# Patient Record
Sex: Male | Born: 1940 | Race: White | Hispanic: No | Marital: Married | Smoking: Never smoker
Health system: Southern US, Community
[De-identification: ages and names within clinical notes are randomized; demographics above are authoritative.]

## PROBLEM LIST (undated history)

## (undated) DIAGNOSIS — K219 Gastro-esophageal reflux disease without esophagitis: Secondary | ICD-10-CM

## (undated) DIAGNOSIS — E119 Type 2 diabetes mellitus without complications: Secondary | ICD-10-CM

## (undated) DIAGNOSIS — E785 Hyperlipidemia, unspecified: Secondary | ICD-10-CM

## (undated) HISTORY — DX: Gastro-esophageal reflux disease without esophagitis: K21.9

## (undated) HISTORY — DX: Hyperlipidemia, unspecified: E78.5

## (undated) HISTORY — DX: Type 2 diabetes mellitus without complications: E11.9

---

## 2005-11-15 ENCOUNTER — Ambulatory Visit: Payer: Self-pay | Admitting: Gastroenterology

## 2005-11-27 ENCOUNTER — Ambulatory Visit: Payer: Self-pay | Admitting: Otolaryngology

## 2005-11-27 ENCOUNTER — Other Ambulatory Visit: Payer: Self-pay

## 2005-12-06 ENCOUNTER — Ambulatory Visit: Payer: Self-pay | Admitting: Otolaryngology

## 2006-06-05 ENCOUNTER — Ambulatory Visit: Payer: Self-pay | Admitting: Otolaryngology

## 2007-02-23 ENCOUNTER — Ambulatory Visit: Payer: Self-pay | Admitting: Ophthalmology

## 2007-03-30 ENCOUNTER — Ambulatory Visit: Payer: Self-pay | Admitting: Ophthalmology

## 2007-11-11 ENCOUNTER — Ambulatory Visit: Payer: Self-pay | Admitting: Otolaryngology

## 2007-11-18 ENCOUNTER — Ambulatory Visit: Payer: Self-pay | Admitting: Otolaryngology

## 2009-11-20 ENCOUNTER — Ambulatory Visit: Payer: Self-pay | Admitting: Radiation Oncology

## 2009-12-11 ENCOUNTER — Ambulatory Visit: Payer: Self-pay | Admitting: Radiation Oncology

## 2009-12-21 ENCOUNTER — Ambulatory Visit: Payer: Self-pay | Admitting: Radiation Oncology

## 2011-04-23 HISTORY — PX: VOCAL CORD INJECTION: SHX2663

## 2011-08-23 ENCOUNTER — Emergency Department: Payer: Self-pay | Admitting: *Deleted

## 2011-12-24 ENCOUNTER — Ambulatory Visit: Payer: Self-pay | Admitting: Otolaryngology

## 2011-12-25 ENCOUNTER — Ambulatory Visit: Payer: Self-pay | Admitting: Otolaryngology

## 2011-12-26 HISTORY — PX: HERNIA REPAIR: SHX51

## 2011-12-27 LAB — PATHOLOGY REPORT

## 2013-09-27 ENCOUNTER — Ambulatory Visit (INDEPENDENT_AMBULATORY_CARE_PROVIDER_SITE_OTHER): Payer: Medicare Other | Admitting: General Surgery

## 2013-09-27 ENCOUNTER — Encounter: Payer: Self-pay | Admitting: General Surgery

## 2013-09-27 VITALS — BP 128/72 | HR 74 | Resp 14 | Ht 67.0 in | Wt 175.0 lb

## 2013-09-27 DIAGNOSIS — R109 Unspecified abdominal pain: Secondary | ICD-10-CM

## 2013-09-27 DIAGNOSIS — R1032 Left lower quadrant pain: Secondary | ICD-10-CM

## 2013-09-27 NOTE — Progress Notes (Signed)
Patient ID: OLSON LUCARELLI, male   DOB: [birth year], 73 y.o.   MRN: [id number]  Chief Complaint  Patient presents with  . Other    inguinal hernia    HPI LUIGI STUCKEY is a 73 y.o. male here today for an evaluation discomfort at the siteof his left inguinal hernia repair completed in 2013.  He states the left inguinal area their is an burning pain been going on since his surgery. This has not interfered with his activity. He was hoping that his symptoms would resolve.  The patient reports when he is been placed on anti-inflammatories in the past for his left hip bursitis he is appreciated an improvement in the burning sensation in the left groin. HPI    Past Medical History  Diagnosis Date  . Diabetes mellitus without complication   . GERD (gastroesophageal reflux disease)   . Hyperlipidemia     Past Surgical History  Procedure Laterality Date  . Hernia repair Left 12/26/2011  . Vocal cord injection  2013    biopsy    No family history on file.  Social History History  Substance Use Topics  . Smoking status: Never Smoker   . Smokeless tobacco: Never Used  . Alcohol Use: Yes    No Known Allergies  Current Outpatient Prescriptions  Medication Sig Dispense Refill  . finasteride (PROSCAR) 5 MG tablet Take 1 tablet by mouth daily.      Marland Kitchen glipiZIDE (GLUCOTROL XL) 2.5 MG 24 hr tablet Take 1 tablet by mouth daily.      . meloxicam (MOBIC) 15 MG tablet Take 1 tablet by mouth daily.      Marland Kitchen omeprazole (PRILOSEC) 40 MG capsule Take 1 capsule by mouth daily.      . pravastatin (PRAVACHOL) 20 MG tablet Take 1 tablet by mouth daily.       No current facility-administered medications for this visit.    Review of Systems Review of Systems  Blood pressure 128/72, pulse 74, resp. rate 14, height 5\' 7"  (1.702 m), weight 175 lb (79.379 kg).  Physical Exam Physical Exam  Constitutional: He is oriented to person, place, and time. He appears well-developed and well-nourished.   Eyes: Conjunctivae are normal. No scleral icterus.  Neck: Neck supple.  Cardiovascular: Normal rate, regular rhythm and normal heart sounds.   Pulmonary/Chest: Effort normal and breath sounds normal.  Abdominal: Soft. Normal appearance and bowel sounds are normal. There is no hepatomegaly. There is no tenderness. A hernia is present. Hernia confirmed positive in the right inguinal area (small). Hernia confirmed negative in the left inguinal area.  Neurological: He is alert and oriented to person, place, and time.  Skin: Skin is warm and dry.    Data Reviewed December 26, 2011 operative note. Ultrapro mesh placed for repair of a indirect hernia.  Assessment    Possible residual nerve irritation status post hernia repair.     Plan    The patient declined steroid injection at this time, anticipating a new prescription for prednisone with the orthopedic service. He was encouraged to call should he desire reevaluation.     PCP: Dorna Mai Kawena Lyday 09/28/2013, 6:58 PM

## 2013-09-27 NOTE — Patient Instructions (Signed)
Patient to return as needed. 

## 2013-09-28 ENCOUNTER — Ambulatory Visit: Payer: Self-pay | Admitting: Anesthesiology

## 2013-09-28 DIAGNOSIS — R1032 Left lower quadrant pain: Secondary | ICD-10-CM | POA: Insufficient documentation

## 2013-09-28 LAB — BASIC METABOLIC PANEL
Anion Gap: 2 — ABNORMAL LOW (ref 7–16)
BUN: 20 mg/dL — AB (ref 7–18)
CALCIUM: 9 mg/dL (ref 8.5–10.1)
Chloride: 107 mmol/L (ref 98–107)
Co2: 29 mmol/L (ref 21–32)
Creatinine: 0.98 mg/dL (ref 0.60–1.30)
Glucose: 43 mg/dL — ABNORMAL LOW (ref 65–99)
Osmolality: 275 (ref 275–301)
Potassium: 4.1 mmol/L (ref 3.5–5.1)
Sodium: 138 mmol/L (ref 136–145)

## 2013-09-29 ENCOUNTER — Ambulatory Visit: Payer: Self-pay | Admitting: Otolaryngology

## 2013-09-30 LAB — PATHOLOGY REPORT

## 2013-10-04 ENCOUNTER — Ambulatory Visit: Payer: Self-pay | Admitting: Radiation Oncology

## 2013-10-06 ENCOUNTER — Ambulatory Visit: Payer: Self-pay | Admitting: Radiation Oncology

## 2013-10-14 ENCOUNTER — Encounter: Payer: Self-pay | Admitting: General Surgery

## 2013-10-20 ENCOUNTER — Ambulatory Visit: Payer: Self-pay | Admitting: Radiation Oncology

## 2013-11-01 LAB — CBC CANCER CENTER
Basophil #: 0 x10 3/mm (ref 0.0–0.1)
Basophil %: 0.4 %
EOS ABS: 0.3 x10 3/mm (ref 0.0–0.7)
EOS PCT: 4.4 %
HCT: 38.8 % — ABNORMAL LOW (ref 40.0–52.0)
HGB: 12.8 g/dL — ABNORMAL LOW (ref 13.0–18.0)
LYMPHS ABS: 2.3 x10 3/mm (ref 1.0–3.6)
Lymphocyte %: 32.7 %
MCH: 30.6 pg (ref 26.0–34.0)
MCHC: 33 g/dL (ref 32.0–36.0)
MCV: 93 fL (ref 80–100)
Monocyte #: 0.9 x10 3/mm (ref 0.2–1.0)
Monocyte %: 13.5 %
Neutrophil #: 3.4 x10 3/mm (ref 1.4–6.5)
Neutrophil %: 49 %
PLATELETS: 275 x10 3/mm (ref 150–440)
RBC: 4.19 10*6/uL — ABNORMAL LOW (ref 4.40–5.90)
RDW: 13.3 % (ref 11.5–14.5)
WBC: 7 x10 3/mm (ref 3.8–10.6)

## 2013-11-08 LAB — CBC CANCER CENTER
Basophil #: 0 x10 3/mm (ref 0.0–0.1)
Basophil %: 0.6 %
Eosinophil #: 0.2 x10 3/mm (ref 0.0–0.7)
Eosinophil %: 3.6 %
HCT: 40.4 % (ref 40.0–52.0)
HGB: 13.3 g/dL (ref 13.0–18.0)
Lymphocyte #: 1.7 x10 3/mm (ref 1.0–3.6)
Lymphocyte %: 26.1 %
MCH: 30.2 pg (ref 26.0–34.0)
MCHC: 32.9 g/dL (ref 32.0–36.0)
MCV: 92 fL (ref 80–100)
Monocyte #: 0.8 x10 3/mm (ref 0.2–1.0)
Monocyte %: 11.9 %
Neutrophil #: 3.8 x10 3/mm (ref 1.4–6.5)
Neutrophil %: 57.8 %
Platelet: 281 x10 3/mm (ref 150–440)
RBC: 4.4 10*6/uL (ref 4.40–5.90)
RDW: 13.7 % (ref 11.5–14.5)
WBC: 6.5 x10 3/mm (ref 3.8–10.6)

## 2013-11-15 LAB — CBC CANCER CENTER
BASOS PCT: 0.5 %
Basophil #: 0 x10 3/mm (ref 0.0–0.1)
EOS PCT: 4.9 %
Eosinophil #: 0.4 x10 3/mm (ref 0.0–0.7)
HCT: 39.1 % — AB (ref 40.0–52.0)
HGB: 12.9 g/dL — AB (ref 13.0–18.0)
LYMPHS ABS: 1.9 x10 3/mm (ref 1.0–3.6)
LYMPHS PCT: 23.6 %
MCH: 30.8 pg (ref 26.0–34.0)
MCHC: 33.1 g/dL (ref 32.0–36.0)
MCV: 93 fL (ref 80–100)
MONOS PCT: 13.1 %
Monocyte #: 1 x10 3/mm (ref 0.2–1.0)
Neutrophil #: 4.6 x10 3/mm (ref 1.4–6.5)
Neutrophil %: 57.9 %
PLATELETS: 274 x10 3/mm (ref 150–440)
RBC: 4.2 10*6/uL — ABNORMAL LOW (ref 4.40–5.90)
RDW: 13.6 % (ref 11.5–14.5)
WBC: 7.9 x10 3/mm (ref 3.8–10.6)

## 2013-11-20 ENCOUNTER — Ambulatory Visit: Payer: Self-pay | Admitting: Radiation Oncology

## 2013-11-22 LAB — CBC CANCER CENTER
Basophil #: 0 x10 3/mm (ref 0.0–0.1)
Basophil %: 0.5 %
Eosinophil #: 0.3 x10 3/mm (ref 0.0–0.7)
Eosinophil %: 5.2 %
HCT: 38.7 % — ABNORMAL LOW (ref 40.0–52.0)
HGB: 12.8 g/dL — ABNORMAL LOW (ref 13.0–18.0)
LYMPHS PCT: 28.1 %
Lymphocyte #: 1.8 x10 3/mm (ref 1.0–3.6)
MCH: 30.9 pg (ref 26.0–34.0)
MCHC: 33.1 g/dL (ref 32.0–36.0)
MCV: 93 fL (ref 80–100)
Monocyte #: 0.9 x10 3/mm (ref 0.2–1.0)
Monocyte %: 13 %
Neutrophil #: 3.5 x10 3/mm (ref 1.4–6.5)
Neutrophil %: 53.2 %
Platelet: 270 x10 3/mm (ref 150–440)
RBC: 4.15 10*6/uL — ABNORMAL LOW (ref 4.40–5.90)
RDW: 13.5 % (ref 11.5–14.5)
WBC: 6.6 x10 3/mm (ref 3.8–10.6)

## 2013-11-29 LAB — CBC CANCER CENTER
Basophil #: 0 x10 3/mm (ref 0.0–0.1)
Basophil %: 0.4 %
EOS PCT: 4.1 %
Eosinophil #: 0.3 x10 3/mm (ref 0.0–0.7)
HCT: 38.6 % — ABNORMAL LOW (ref 40.0–52.0)
HGB: 12.8 g/dL — ABNORMAL LOW (ref 13.0–18.0)
LYMPHS ABS: 1.9 x10 3/mm (ref 1.0–3.6)
Lymphocyte %: 27 %
MCH: 30.7 pg (ref 26.0–34.0)
MCHC: 33.2 g/dL (ref 32.0–36.0)
MCV: 93 fL (ref 80–100)
MONO ABS: 0.9 x10 3/mm (ref 0.2–1.0)
MONOS PCT: 12.9 %
NEUTROS ABS: 4 x10 3/mm (ref 1.4–6.5)
NEUTROS PCT: 55.6 %
PLATELETS: 283 x10 3/mm (ref 150–440)
RBC: 4.17 10*6/uL — AB (ref 4.40–5.90)
RDW: 13.2 % (ref 11.5–14.5)
WBC: 7.2 x10 3/mm (ref 3.8–10.6)

## 2013-12-06 LAB — CBC CANCER CENTER
BASOS ABS: 0 x10 3/mm (ref 0.0–0.1)
Basophil %: 0.4 %
Eosinophil #: 0.3 x10 3/mm (ref 0.0–0.7)
Eosinophil %: 5.3 %
HCT: 38.4 % — AB (ref 40.0–52.0)
HGB: 12.4 g/dL — ABNORMAL LOW (ref 13.0–18.0)
LYMPHS PCT: 23.6 %
Lymphocyte #: 1.4 x10 3/mm (ref 1.0–3.6)
MCH: 30.1 pg (ref 26.0–34.0)
MCHC: 32.3 g/dL (ref 32.0–36.0)
MCV: 93 fL (ref 80–100)
Monocyte #: 0.8 x10 3/mm (ref 0.2–1.0)
Monocyte %: 13.2 %
NEUTROS ABS: 3.3 x10 3/mm (ref 1.4–6.5)
NEUTROS PCT: 57.5 %
Platelet: 272 x10 3/mm (ref 150–440)
RBC: 4.12 10*6/uL — ABNORMAL LOW (ref 4.40–5.90)
RDW: 13.2 % (ref 11.5–14.5)
WBC: 5.8 x10 3/mm (ref 3.8–10.6)

## 2013-12-21 ENCOUNTER — Ambulatory Visit: Payer: Self-pay | Admitting: Radiation Oncology

## 2014-01-20 ENCOUNTER — Ambulatory Visit: Payer: Self-pay | Admitting: Radiation Oncology

## 2014-03-07 ENCOUNTER — Ambulatory Visit: Payer: Self-pay | Admitting: Radiation Oncology

## 2014-03-20 ENCOUNTER — Emergency Department: Payer: Self-pay | Admitting: Emergency Medicine

## 2014-03-20 LAB — BASIC METABOLIC PANEL
ANION GAP: 7 (ref 7–16)
BUN: 21 mg/dL — AB (ref 7–18)
CHLORIDE: 100 mmol/L (ref 98–107)
CO2: 31 mmol/L (ref 21–32)
CREATININE: 0.98 mg/dL (ref 0.60–1.30)
Calcium, Total: 9 mg/dL (ref 8.5–10.1)
Glucose: 126 mg/dL — ABNORMAL HIGH (ref 65–99)
Osmolality: 280 (ref 275–301)
Potassium: 4.1 mmol/L (ref 3.5–5.1)
Sodium: 138 mmol/L (ref 136–145)

## 2014-03-20 LAB — CBC
HCT: 41.4 % (ref 40.0–52.0)
HGB: 13.2 g/dL (ref 13.0–18.0)
MCH: 30 pg (ref 26.0–34.0)
MCHC: 31.9 g/dL — ABNORMAL LOW (ref 32.0–36.0)
MCV: 94 fL (ref 80–100)
Platelet: 385 10*3/uL (ref 150–440)
RBC: 4.41 10*6/uL (ref 4.40–5.90)
RDW: 13.2 % (ref 11.5–14.5)
WBC: 10.5 10*3/uL (ref 3.8–10.6)

## 2014-03-20 LAB — TROPONIN I: Troponin-I: <0.02 ng/mL

## 2014-03-22 ENCOUNTER — Ambulatory Visit: Payer: Self-pay | Admitting: Radiation Oncology

## 2014-04-02 ENCOUNTER — Inpatient Hospital Stay: Payer: Self-pay | Admitting: Internal Medicine

## 2014-04-02 LAB — CBC
HCT: 42.4 % (ref 40.0–52.0)
HGB: 13.9 g/dL (ref 13.0–18.0)
MCH: 30.4 pg (ref 26.0–34.0)
MCHC: 32.7 g/dL (ref 32.0–36.0)
MCV: 93 fL (ref 80–100)
Platelet: 448 10*3/uL — ABNORMAL HIGH (ref 150–440)
RBC: 4.55 10*6/uL (ref 4.40–5.90)
RDW: 12.7 % (ref 11.5–14.5)
WBC: 8.6 10*3/uL (ref 3.8–10.6)

## 2014-04-02 LAB — BASIC METABOLIC PANEL
Anion Gap: 6 — ABNORMAL LOW (ref 7–16)
BUN: 22 mg/dL — AB (ref 7–18)
CALCIUM: 8.9 mg/dL (ref 8.5–10.1)
CHLORIDE: 102 mmol/L (ref 98–107)
Co2: 31 mmol/L (ref 21–32)
Creatinine: 1.01 mg/dL (ref 0.60–1.30)
EGFR (African American): >60
Glucose: 182 mg/dL — ABNORMAL HIGH (ref 65–99)
Osmolality: 286 (ref 275–301)
Potassium: 3.8 mmol/L (ref 3.5–5.1)
Sodium: 139 mmol/L (ref 136–145)

## 2014-04-02 LAB — TROPONIN I: TROPONIN-I: 0.02 ng/mL

## 2014-04-03 LAB — CBC WITH DIFFERENTIAL/PLATELET
Basophil #: 0 10*3/uL (ref 0.0–0.1)
Basophil %: 0.1 %
EOS PCT: 0 %
Eosinophil #: 0 10*3/uL (ref 0.0–0.7)
HCT: 38.6 % — AB (ref 40.0–52.0)
HGB: 12.8 g/dL — ABNORMAL LOW (ref 13.0–18.0)
LYMPHS PCT: 6.6 %
Lymphocyte #: 0.6 10*3/uL — ABNORMAL LOW (ref 1.0–3.6)
MCH: 30.7 pg (ref 26.0–34.0)
MCHC: 33.2 g/dL (ref 32.0–36.0)
MCV: 92 fL (ref 80–100)
Monocyte #: 0.2 x10 3/mm (ref 0.2–1.0)
Monocyte %: 1.9 %
NEUTROS ABS: 7.9 10*3/uL — AB (ref 1.4–6.5)
Neutrophil %: 91.4 %
Platelet: 389 10*3/uL (ref 150–440)
RBC: 4.18 10*6/uL — AB (ref 4.40–5.90)
RDW: 12.6 % (ref 11.5–14.5)
WBC: 8.7 10*3/uL (ref 3.8–10.6)

## 2014-04-03 LAB — BASIC METABOLIC PANEL
Anion Gap: 6 — ABNORMAL LOW (ref 7–16)
BUN: 15 mg/dL (ref 7–18)
Calcium, Total: 8.6 mg/dL (ref 8.5–10.1)
Chloride: 104 mmol/L (ref 98–107)
Co2: 30 mmol/L (ref 21–32)
Creatinine: 0.84 mg/dL (ref 0.60–1.30)
EGFR (African American): >60
EGFR (Non-African Amer.): >60
Glucose: 170 mg/dL — ABNORMAL HIGH (ref 65–99)
Osmolality: 284 (ref 275–301)
Potassium: 4.3 mmol/L (ref 3.5–5.1)
Sodium: 140 mmol/L (ref 136–145)

## 2014-04-29 DIAGNOSIS — B379 Candidiasis, unspecified: Secondary | ICD-10-CM | POA: Diagnosis not present

## 2014-04-29 DIAGNOSIS — R490 Dysphonia: Secondary | ICD-10-CM | POA: Diagnosis not present

## 2014-04-29 DIAGNOSIS — J370 Chronic laryngitis: Secondary | ICD-10-CM | POA: Diagnosis not present

## 2014-04-29 DIAGNOSIS — F064 Anxiety disorder due to known physiological condition: Secondary | ICD-10-CM | POA: Diagnosis not present

## 2014-04-29 DIAGNOSIS — Z87891 Personal history of nicotine dependence: Secondary | ICD-10-CM | POA: Diagnosis not present

## 2014-05-03 ENCOUNTER — Ambulatory Visit: Payer: Self-pay | Admitting: Radiation Oncology

## 2014-05-04 DIAGNOSIS — C329 Malignant neoplasm of larynx, unspecified: Secondary | ICD-10-CM | POA: Diagnosis not present

## 2014-05-05 DIAGNOSIS — Z08 Encounter for follow-up examination after completed treatment for malignant neoplasm: Secondary | ICD-10-CM | POA: Diagnosis not present

## 2014-05-05 DIAGNOSIS — Z8521 Personal history of malignant neoplasm of larynx: Secondary | ICD-10-CM | POA: Diagnosis not present

## 2014-05-05 DIAGNOSIS — R590 Localized enlarged lymph nodes: Secondary | ICD-10-CM | POA: Diagnosis not present

## 2014-05-05 DIAGNOSIS — Z87891 Personal history of nicotine dependence: Secondary | ICD-10-CM | POA: Diagnosis not present

## 2014-05-08 DIAGNOSIS — Z930 Tracheostomy status: Secondary | ICD-10-CM | POA: Diagnosis not present

## 2014-05-08 DIAGNOSIS — C320 Malignant neoplasm of glottis: Secondary | ICD-10-CM | POA: Diagnosis not present

## 2014-05-08 DIAGNOSIS — C760 Malignant neoplasm of head, face and neck: Secondary | ICD-10-CM | POA: Diagnosis not present

## 2014-05-08 DIAGNOSIS — K449 Diaphragmatic hernia without obstruction or gangrene: Secondary | ICD-10-CM | POA: Diagnosis not present

## 2014-05-08 DIAGNOSIS — R221 Localized swelling, mass and lump, neck: Secondary | ICD-10-CM | POA: Diagnosis not present

## 2014-05-08 DIAGNOSIS — I10 Essential (primary) hypertension: Secondary | ICD-10-CM | POA: Diagnosis not present

## 2014-05-08 DIAGNOSIS — M6250 Muscle wasting and atrophy, not elsewhere classified, unspecified site: Secondary | ICD-10-CM | POA: Diagnosis not present

## 2014-05-08 DIAGNOSIS — R1310 Dysphagia, unspecified: Secondary | ICD-10-CM | POA: Diagnosis not present

## 2014-05-08 DIAGNOSIS — J9600 Acute respiratory failure, unspecified whether with hypoxia or hypercapnia: Secondary | ICD-10-CM | POA: Diagnosis not present

## 2014-05-08 DIAGNOSIS — R05 Cough: Secondary | ICD-10-CM | POA: Diagnosis not present

## 2014-05-08 DIAGNOSIS — C329 Malignant neoplasm of larynx, unspecified: Secondary | ICD-10-CM | POA: Diagnosis not present

## 2014-05-08 DIAGNOSIS — R070 Pain in throat: Secondary | ICD-10-CM | POA: Diagnosis not present

## 2014-05-08 DIAGNOSIS — D170 Benign lipomatous neoplasm of skin and subcutaneous tissue of head, face and neck: Secondary | ICD-10-CM | POA: Diagnosis not present

## 2014-05-08 DIAGNOSIS — I517 Cardiomegaly: Secondary | ICD-10-CM | POA: Diagnosis not present

## 2014-05-08 DIAGNOSIS — C801 Malignant (primary) neoplasm, unspecified: Secondary | ICD-10-CM | POA: Diagnosis not present

## 2014-05-08 DIAGNOSIS — M6281 Muscle weakness (generalized): Secondary | ICD-10-CM | POA: Diagnosis not present

## 2014-05-08 DIAGNOSIS — Z6824 Body mass index (BMI) 24.0-24.9, adult: Secondary | ICD-10-CM | POA: Diagnosis not present

## 2014-05-08 DIAGNOSIS — R279 Unspecified lack of coordination: Secondary | ICD-10-CM | POA: Diagnosis not present

## 2014-05-08 DIAGNOSIS — Z515 Encounter for palliative care: Secondary | ICD-10-CM | POA: Diagnosis not present

## 2014-05-08 DIAGNOSIS — I700 Atherosclerosis of aorta: Secondary | ICD-10-CM | POA: Diagnosis not present

## 2014-05-08 DIAGNOSIS — E43 Unspecified severe protein-calorie malnutrition: Secondary | ICD-10-CM | POA: Diagnosis not present

## 2014-05-08 DIAGNOSIS — I080 Rheumatic disorders of both mitral and aortic valves: Secondary | ICD-10-CM | POA: Diagnosis not present

## 2014-05-08 DIAGNOSIS — Z4682 Encounter for fitting and adjustment of non-vascular catheter: Secondary | ICD-10-CM | POA: Diagnosis not present

## 2014-05-08 DIAGNOSIS — R262 Difficulty in walking, not elsewhere classified: Secondary | ICD-10-CM | POA: Diagnosis not present

## 2014-05-08 DIAGNOSIS — K219 Gastro-esophageal reflux disease without esophagitis: Secondary | ICD-10-CM | POA: Diagnosis not present

## 2014-05-08 DIAGNOSIS — Z8521 Personal history of malignant neoplasm of larynx: Secondary | ICD-10-CM | POA: Diagnosis not present

## 2014-05-08 DIAGNOSIS — J9690 Respiratory failure, unspecified, unspecified whether with hypoxia or hypercapnia: Secondary | ICD-10-CM | POA: Diagnosis not present

## 2014-05-08 DIAGNOSIS — R634 Abnormal weight loss: Secondary | ICD-10-CM | POA: Diagnosis not present

## 2014-05-08 DIAGNOSIS — Z79899 Other long term (current) drug therapy: Secondary | ICD-10-CM | POA: Diagnosis not present

## 2014-05-08 DIAGNOSIS — Z87891 Personal history of nicotine dependence: Secondary | ICD-10-CM | POA: Diagnosis not present

## 2014-05-08 DIAGNOSIS — Z882 Allergy status to sulfonamides status: Secondary | ICD-10-CM | POA: Diagnosis not present

## 2014-05-08 DIAGNOSIS — I480 Paroxysmal atrial fibrillation: Secondary | ICD-10-CM | POA: Diagnosis not present

## 2014-05-08 DIAGNOSIS — R531 Weakness: Secondary | ICD-10-CM | POA: Diagnosis not present

## 2014-05-08 DIAGNOSIS — Z923 Personal history of irradiation: Secondary | ICD-10-CM | POA: Diagnosis not present

## 2014-05-08 DIAGNOSIS — Z792 Long term (current) use of antibiotics: Secondary | ICD-10-CM | POA: Diagnosis not present

## 2014-05-08 DIAGNOSIS — R627 Adult failure to thrive: Secondary | ICD-10-CM | POA: Diagnosis not present

## 2014-05-08 DIAGNOSIS — J387 Other diseases of larynx: Secondary | ICD-10-CM | POA: Diagnosis not present

## 2014-05-08 DIAGNOSIS — J9601 Acute respiratory failure with hypoxia: Secondary | ICD-10-CM | POA: Diagnosis not present

## 2014-05-08 DIAGNOSIS — R102 Pelvic and perineal pain: Secondary | ICD-10-CM | POA: Diagnosis not present

## 2014-05-08 DIAGNOSIS — I4891 Unspecified atrial fibrillation: Secondary | ICD-10-CM | POA: Diagnosis not present

## 2014-05-08 DIAGNOSIS — I4430 Unspecified atrioventricular block: Secondary | ICD-10-CM | POA: Diagnosis not present

## 2014-05-08 DIAGNOSIS — I058 Other rheumatic mitral valve diseases: Secondary | ICD-10-CM | POA: Diagnosis not present

## 2014-05-08 DIAGNOSIS — E118 Type 2 diabetes mellitus with unspecified complications: Secondary | ICD-10-CM | POA: Diagnosis not present

## 2014-05-08 DIAGNOSIS — Z7952 Long term (current) use of systemic steroids: Secondary | ICD-10-CM | POA: Diagnosis not present

## 2014-05-08 DIAGNOSIS — K1321 Leukoplakia of oral mucosa, including tongue: Secondary | ICD-10-CM | POA: Diagnosis not present

## 2014-05-08 DIAGNOSIS — E119 Type 2 diabetes mellitus without complications: Secondary | ICD-10-CM | POA: Diagnosis not present

## 2014-05-08 DIAGNOSIS — Z79891 Long term (current) use of opiate analgesic: Secondary | ICD-10-CM | POA: Diagnosis not present

## 2014-05-08 DIAGNOSIS — K5900 Constipation, unspecified: Secondary | ICD-10-CM | POA: Diagnosis not present

## 2014-05-08 DIAGNOSIS — R633 Feeding difficulties: Secondary | ICD-10-CM | POA: Diagnosis not present

## 2014-05-08 DIAGNOSIS — E860 Dehydration: Secondary | ICD-10-CM | POA: Diagnosis not present

## 2014-05-19 DIAGNOSIS — K5900 Constipation, unspecified: Secondary | ICD-10-CM | POA: Diagnosis not present

## 2014-05-19 DIAGNOSIS — A419 Sepsis, unspecified organism: Secondary | ICD-10-CM | POA: Diagnosis not present

## 2014-05-19 DIAGNOSIS — Z931 Gastrostomy status: Secondary | ICD-10-CM | POA: Diagnosis not present

## 2014-05-19 DIAGNOSIS — F4322 Adjustment disorder with anxiety: Secondary | ICD-10-CM | POA: Diagnosis not present

## 2014-05-19 DIAGNOSIS — K449 Diaphragmatic hernia without obstruction or gangrene: Secondary | ICD-10-CM | POA: Diagnosis not present

## 2014-05-19 DIAGNOSIS — I4891 Unspecified atrial fibrillation: Secondary | ICD-10-CM | POA: Diagnosis not present

## 2014-05-19 DIAGNOSIS — I509 Heart failure, unspecified: Secondary | ICD-10-CM | POA: Diagnosis not present

## 2014-05-19 DIAGNOSIS — J189 Pneumonia, unspecified organism: Secondary | ICD-10-CM | POA: Diagnosis not present

## 2014-05-19 DIAGNOSIS — Z930 Tracheostomy status: Secondary | ICD-10-CM | POA: Diagnosis not present

## 2014-05-19 DIAGNOSIS — D62 Acute posthemorrhagic anemia: Secondary | ICD-10-CM | POA: Diagnosis not present

## 2014-05-19 DIAGNOSIS — E860 Dehydration: Secondary | ICD-10-CM | POA: Diagnosis not present

## 2014-05-19 DIAGNOSIS — I252 Old myocardial infarction: Secondary | ICD-10-CM | POA: Diagnosis not present

## 2014-05-19 DIAGNOSIS — R531 Weakness: Secondary | ICD-10-CM | POA: Diagnosis not present

## 2014-05-19 DIAGNOSIS — R1310 Dysphagia, unspecified: Secondary | ICD-10-CM | POA: Diagnosis not present

## 2014-05-19 DIAGNOSIS — E785 Hyperlipidemia, unspecified: Secondary | ICD-10-CM | POA: Diagnosis not present

## 2014-05-19 DIAGNOSIS — J9601 Acute respiratory failure with hypoxia: Secondary | ICD-10-CM | POA: Diagnosis not present

## 2014-05-19 DIAGNOSIS — C159 Malignant neoplasm of esophagus, unspecified: Secondary | ICD-10-CM | POA: Diagnosis not present

## 2014-05-19 DIAGNOSIS — K219 Gastro-esophageal reflux disease without esophagitis: Secondary | ICD-10-CM | POA: Diagnosis not present

## 2014-05-19 DIAGNOSIS — Z923 Personal history of irradiation: Secondary | ICD-10-CM | POA: Diagnosis not present

## 2014-05-19 DIAGNOSIS — R627 Adult failure to thrive: Secondary | ICD-10-CM | POA: Diagnosis not present

## 2014-05-19 DIAGNOSIS — R070 Pain in throat: Secondary | ICD-10-CM | POA: Diagnosis not present

## 2014-05-19 DIAGNOSIS — I480 Paroxysmal atrial fibrillation: Secondary | ICD-10-CM | POA: Diagnosis not present

## 2014-05-19 DIAGNOSIS — C329 Malignant neoplasm of larynx, unspecified: Secondary | ICD-10-CM | POA: Diagnosis not present

## 2014-05-19 DIAGNOSIS — Z87891 Personal history of nicotine dependence: Secondary | ICD-10-CM | POA: Diagnosis not present

## 2014-05-19 DIAGNOSIS — E871 Hypo-osmolality and hyponatremia: Secondary | ICD-10-CM | POA: Diagnosis not present

## 2014-05-19 DIAGNOSIS — Z8521 Personal history of malignant neoplasm of larynx: Secondary | ICD-10-CM | POA: Diagnosis not present

## 2014-05-19 DIAGNOSIS — J9600 Acute respiratory failure, unspecified whether with hypoxia or hypercapnia: Secondary | ICD-10-CM | POA: Diagnosis not present

## 2014-05-19 DIAGNOSIS — M6281 Muscle weakness (generalized): Secondary | ICD-10-CM | POA: Diagnosis not present

## 2014-05-19 DIAGNOSIS — Z515 Encounter for palliative care: Secondary | ICD-10-CM | POA: Diagnosis not present

## 2014-05-19 DIAGNOSIS — I700 Atherosclerosis of aorta: Secondary | ICD-10-CM | POA: Diagnosis not present

## 2014-05-19 DIAGNOSIS — E873 Alkalosis: Secondary | ICD-10-CM | POA: Diagnosis not present

## 2014-05-19 DIAGNOSIS — J690 Pneumonitis due to inhalation of food and vomit: Secondary | ICD-10-CM | POA: Diagnosis not present

## 2014-05-19 DIAGNOSIS — I10 Essential (primary) hypertension: Secondary | ICD-10-CM | POA: Diagnosis not present

## 2014-05-19 DIAGNOSIS — E43 Unspecified severe protein-calorie malnutrition: Secondary | ICD-10-CM | POA: Diagnosis not present

## 2014-05-19 DIAGNOSIS — Z048 Encounter for examination and observation for other specified reasons: Secondary | ICD-10-CM | POA: Diagnosis not present

## 2014-05-19 DIAGNOSIS — F329 Major depressive disorder, single episode, unspecified: Secondary | ICD-10-CM | POA: Diagnosis not present

## 2014-05-19 DIAGNOSIS — R279 Unspecified lack of coordination: Secondary | ICD-10-CM | POA: Diagnosis not present

## 2014-05-19 DIAGNOSIS — I214 Non-ST elevation (NSTEMI) myocardial infarction: Secondary | ICD-10-CM | POA: Diagnosis not present

## 2014-05-19 DIAGNOSIS — R262 Difficulty in walking, not elsewhere classified: Secondary | ICD-10-CM | POA: Diagnosis not present

## 2014-05-19 DIAGNOSIS — E119 Type 2 diabetes mellitus without complications: Secondary | ICD-10-CM | POA: Diagnosis not present

## 2014-05-19 DIAGNOSIS — M6250 Muscle wasting and atrophy, not elsewhere classified, unspecified site: Secondary | ICD-10-CM | POA: Diagnosis not present

## 2014-05-20 DIAGNOSIS — E119 Type 2 diabetes mellitus without complications: Secondary | ICD-10-CM | POA: Diagnosis not present

## 2014-05-20 DIAGNOSIS — C329 Malignant neoplasm of larynx, unspecified: Secondary | ICD-10-CM | POA: Diagnosis not present

## 2014-05-20 DIAGNOSIS — I4891 Unspecified atrial fibrillation: Secondary | ICD-10-CM | POA: Diagnosis not present

## 2014-05-23 ENCOUNTER — Ambulatory Visit: Payer: Self-pay | Admitting: Radiation Oncology

## 2014-05-27 DIAGNOSIS — Z931 Gastrostomy status: Secondary | ICD-10-CM | POA: Diagnosis not present

## 2014-05-27 DIAGNOSIS — E871 Hypo-osmolality and hyponatremia: Secondary | ICD-10-CM | POA: Diagnosis not present

## 2014-05-27 DIAGNOSIS — F4322 Adjustment disorder with anxiety: Secondary | ICD-10-CM | POA: Diagnosis not present

## 2014-05-27 DIAGNOSIS — C329 Malignant neoplasm of larynx, unspecified: Secondary | ICD-10-CM | POA: Diagnosis not present

## 2014-06-03 DIAGNOSIS — E871 Hypo-osmolality and hyponatremia: Secondary | ICD-10-CM | POA: Diagnosis not present

## 2014-06-03 DIAGNOSIS — Z930 Tracheostomy status: Secondary | ICD-10-CM | POA: Diagnosis not present

## 2014-06-03 DIAGNOSIS — Z931 Gastrostomy status: Secondary | ICD-10-CM | POA: Diagnosis not present

## 2014-06-03 DIAGNOSIS — C329 Malignant neoplasm of larynx, unspecified: Secondary | ICD-10-CM | POA: Diagnosis not present

## 2014-06-04 DIAGNOSIS — I4891 Unspecified atrial fibrillation: Secondary | ICD-10-CM | POA: Diagnosis not present

## 2014-06-07 DIAGNOSIS — Z515 Encounter for palliative care: Secondary | ICD-10-CM | POA: Diagnosis not present

## 2014-06-07 DIAGNOSIS — I4949 Other premature depolarization: Secondary | ICD-10-CM | POA: Diagnosis not present

## 2014-06-07 DIAGNOSIS — C329 Malignant neoplasm of larynx, unspecified: Secondary | ICD-10-CM | POA: Diagnosis not present

## 2014-06-07 DIAGNOSIS — I10 Essential (primary) hypertension: Secondary | ICD-10-CM | POA: Diagnosis not present

## 2014-06-07 DIAGNOSIS — M6281 Muscle weakness (generalized): Secondary | ICD-10-CM | POA: Diagnosis not present

## 2014-06-07 DIAGNOSIS — Z9889 Other specified postprocedural states: Secondary | ICD-10-CM | POA: Diagnosis not present

## 2014-06-07 DIAGNOSIS — A499 Bacterial infection, unspecified: Secondary | ICD-10-CM | POA: Diagnosis not present

## 2014-06-07 DIAGNOSIS — Z048 Encounter for examination and observation for other specified reasons: Secondary | ICD-10-CM | POA: Diagnosis not present

## 2014-06-07 DIAGNOSIS — Z87891 Personal history of nicotine dependence: Secondary | ICD-10-CM | POA: Diagnosis not present

## 2014-06-07 DIAGNOSIS — E873 Alkalosis: Secondary | ICD-10-CM | POA: Diagnosis not present

## 2014-06-07 DIAGNOSIS — R000 Tachycardia, unspecified: Secondary | ICD-10-CM | POA: Diagnosis not present

## 2014-06-07 DIAGNOSIS — I700 Atherosclerosis of aorta: Secondary | ICD-10-CM | POA: Diagnosis not present

## 2014-06-07 DIAGNOSIS — J9691 Respiratory failure, unspecified with hypoxia: Secondary | ICD-10-CM | POA: Diagnosis not present

## 2014-06-07 DIAGNOSIS — E871 Hypo-osmolality and hyponatremia: Secondary | ICD-10-CM | POA: Diagnosis not present

## 2014-06-07 DIAGNOSIS — Z930 Tracheostomy status: Secondary | ICD-10-CM | POA: Diagnosis not present

## 2014-06-07 DIAGNOSIS — A419 Sepsis, unspecified organism: Secondary | ICD-10-CM | POA: Diagnosis not present

## 2014-06-07 DIAGNOSIS — R531 Weakness: Secondary | ICD-10-CM | POA: Diagnosis not present

## 2014-06-07 DIAGNOSIS — J90 Pleural effusion, not elsewhere classified: Secondary | ICD-10-CM | POA: Diagnosis not present

## 2014-06-07 DIAGNOSIS — R0602 Shortness of breath: Secondary | ICD-10-CM | POA: Diagnosis not present

## 2014-06-07 DIAGNOSIS — R0902 Hypoxemia: Secondary | ICD-10-CM | POA: Diagnosis not present

## 2014-06-07 DIAGNOSIS — Z452 Encounter for adjustment and management of vascular access device: Secondary | ICD-10-CM | POA: Diagnosis not present

## 2014-06-07 DIAGNOSIS — F329 Major depressive disorder, single episode, unspecified: Secondary | ICD-10-CM | POA: Diagnosis not present

## 2014-06-07 DIAGNOSIS — I499 Cardiac arrhythmia, unspecified: Secondary | ICD-10-CM | POA: Diagnosis not present

## 2014-06-07 DIAGNOSIS — I4581 Long QT syndrome: Secondary | ICD-10-CM | POA: Diagnosis not present

## 2014-06-07 DIAGNOSIS — D649 Anemia, unspecified: Secondary | ICD-10-CM | POA: Diagnosis not present

## 2014-06-07 DIAGNOSIS — K269 Duodenal ulcer, unspecified as acute or chronic, without hemorrhage or perforation: Secondary | ICD-10-CM | POA: Diagnosis not present

## 2014-06-07 DIAGNOSIS — J690 Pneumonitis due to inhalation of food and vomit: Secondary | ICD-10-CM | POA: Diagnosis not present

## 2014-06-07 DIAGNOSIS — K219 Gastro-esophageal reflux disease without esophagitis: Secondary | ICD-10-CM | POA: Diagnosis not present

## 2014-06-07 DIAGNOSIS — E119 Type 2 diabetes mellitus without complications: Secondary | ICD-10-CM | POA: Diagnosis not present

## 2014-06-07 DIAGNOSIS — I252 Old myocardial infarction: Secondary | ICD-10-CM | POA: Diagnosis not present

## 2014-06-07 DIAGNOSIS — R627 Adult failure to thrive: Secondary | ICD-10-CM | POA: Diagnosis not present

## 2014-06-07 DIAGNOSIS — Z0389 Encounter for observation for other suspected diseases and conditions ruled out: Secondary | ICD-10-CM | POA: Diagnosis not present

## 2014-06-07 DIAGNOSIS — K264 Chronic or unspecified duodenal ulcer with hemorrhage: Secondary | ICD-10-CM | POA: Diagnosis not present

## 2014-06-07 DIAGNOSIS — C760 Malignant neoplasm of head, face and neck: Secondary | ICD-10-CM | POA: Diagnosis not present

## 2014-06-07 DIAGNOSIS — R279 Unspecified lack of coordination: Secondary | ICD-10-CM | POA: Diagnosis not present

## 2014-06-07 DIAGNOSIS — Z8521 Personal history of malignant neoplasm of larynx: Secondary | ICD-10-CM | POA: Diagnosis not present

## 2014-06-07 DIAGNOSIS — J984 Other disorders of lung: Secondary | ICD-10-CM | POA: Diagnosis not present

## 2014-06-07 DIAGNOSIS — E785 Hyperlipidemia, unspecified: Secondary | ICD-10-CM | POA: Diagnosis not present

## 2014-06-07 DIAGNOSIS — C159 Malignant neoplasm of esophagus, unspecified: Secondary | ICD-10-CM | POA: Diagnosis not present

## 2014-06-07 DIAGNOSIS — C029 Malignant neoplasm of tongue, unspecified: Secondary | ICD-10-CM | POA: Diagnosis not present

## 2014-06-07 DIAGNOSIS — I480 Paroxysmal atrial fibrillation: Secondary | ICD-10-CM | POA: Diagnosis not present

## 2014-06-07 DIAGNOSIS — I509 Heart failure, unspecified: Secondary | ICD-10-CM | POA: Diagnosis not present

## 2014-06-07 DIAGNOSIS — R918 Other nonspecific abnormal finding of lung field: Secondary | ICD-10-CM | POA: Diagnosis not present

## 2014-06-07 DIAGNOSIS — R7889 Finding of other specified substances, not normally found in blood: Secondary | ICD-10-CM | POA: Diagnosis not present

## 2014-06-07 DIAGNOSIS — J9601 Acute respiratory failure with hypoxia: Secondary | ICD-10-CM | POA: Diagnosis not present

## 2014-06-07 DIAGNOSIS — C3490 Malignant neoplasm of unspecified part of unspecified bronchus or lung: Secondary | ICD-10-CM | POA: Diagnosis not present

## 2014-06-07 DIAGNOSIS — R7989 Other specified abnormal findings of blood chemistry: Secondary | ICD-10-CM | POA: Diagnosis not present

## 2014-06-07 DIAGNOSIS — J9811 Atelectasis: Secondary | ICD-10-CM | POA: Diagnosis not present

## 2014-06-07 DIAGNOSIS — J168 Pneumonia due to other specified infectious organisms: Secondary | ICD-10-CM | POA: Diagnosis not present

## 2014-06-07 DIAGNOSIS — K449 Diaphragmatic hernia without obstruction or gangrene: Secondary | ICD-10-CM | POA: Diagnosis not present

## 2014-06-07 DIAGNOSIS — J439 Emphysema, unspecified: Secondary | ICD-10-CM | POA: Diagnosis not present

## 2014-06-07 DIAGNOSIS — J939 Pneumothorax, unspecified: Secondary | ICD-10-CM | POA: Diagnosis not present

## 2014-06-07 DIAGNOSIS — Z931 Gastrostomy status: Secondary | ICD-10-CM | POA: Diagnosis not present

## 2014-06-07 DIAGNOSIS — I959 Hypotension, unspecified: Secondary | ICD-10-CM | POA: Diagnosis not present

## 2014-06-07 DIAGNOSIS — I517 Cardiomegaly: Secondary | ICD-10-CM | POA: Diagnosis not present

## 2014-06-07 DIAGNOSIS — Z48813 Encounter for surgical aftercare following surgery on the respiratory system: Secondary | ICD-10-CM | POA: Diagnosis not present

## 2014-06-07 DIAGNOSIS — J189 Pneumonia, unspecified organism: Secondary | ICD-10-CM | POA: Diagnosis not present

## 2014-06-07 DIAGNOSIS — K260 Acute duodenal ulcer with hemorrhage: Secondary | ICD-10-CM | POA: Diagnosis not present

## 2014-06-07 DIAGNOSIS — K922 Gastrointestinal hemorrhage, unspecified: Secondary | ICD-10-CM | POA: Diagnosis not present

## 2014-06-07 DIAGNOSIS — E861 Hypovolemia: Secondary | ICD-10-CM | POA: Diagnosis not present

## 2014-06-07 DIAGNOSIS — D62 Acute posthemorrhagic anemia: Secondary | ICD-10-CM | POA: Diagnosis not present

## 2014-06-07 DIAGNOSIS — E43 Unspecified severe protein-calorie malnutrition: Secondary | ICD-10-CM | POA: Diagnosis not present

## 2014-06-07 DIAGNOSIS — I4891 Unspecified atrial fibrillation: Secondary | ICD-10-CM | POA: Diagnosis not present

## 2014-06-07 DIAGNOSIS — I498 Other specified cardiac arrhythmias: Secondary | ICD-10-CM | POA: Diagnosis not present

## 2014-06-07 DIAGNOSIS — I214 Non-ST elevation (NSTEMI) myocardial infarction: Secondary | ICD-10-CM | POA: Diagnosis not present

## 2014-07-22 DEATH — deceased

## 2014-08-09 NOTE — Op Note (Signed)
PATIENT NAME:  William Cuevas, William Cuevas MR#:  [id number] DATE OF BIRTH:  02-Nov-1940  DATE OF PROCEDURE:  12/25/2011  PREOPERATIVE DIAGNOSIS: Left true vocal cord lesion.   POSTOPERATIVE DIAGNOSIS: Left true vocal cord lesion.  PROCEDURE: Microlaryngoscopy with microsurgical excision of left true vocal cord lesion and CO2 laser ablation.   SURGEON: Sammuel Hines. Richardson Landry, MD  ANESTHESIA: General endotracheal.   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   FINDINGS: This was a lesion involving the anterior one-third to one-half of the left true vocal cord. The lesion had a dysplastic appearance with thickening of the upper surface of the vocal cord and some scoring or question of invasion of the underlying vocalis muscle.   DESCRIPTION OF PROCEDURE: After obtaining informed consent, the patient was taken to the Operating Room, placed in supine position. After induction of general endotracheal anesthesia with the laser tube, patient was turned 90 degrees. The head was draped with the eyes protected with moist gauze. A tooth guard was placed to protect the upper teeth. A Dedo laryngoscope was used to then evaluate the airway. The hypopharynx, larynx and tongue base were evaluated. The only lesion seen was the left vocal cord lesion. The scope was placed in the laryngeal inlet and the patient placed into suspension. A scope was used to obtain photo documentation of the left true vocal cord lesion. The microscope was then moved into position for better visualization. The left vocal cord was injected with a small amount of 1% lidocaine with epinephrine 1:200,000. The lesion was then carefully excised utilizing microsurgical excision with the microlaryngeal scissors. Lesion was excised with narrow margins and excised dissecting it away from the underlying vocalis muscle. There was some adhesion to the underlying vocalis muscle either from  scarring from previous procedures or possibly some aspect of invasion of the lesion. This could not be determined grossly. Frozen sections were not sent, however, because if there was invasion found on final pathology, the patient would be referred for radiation therapy at this point without excision of the underlying vocalis muscle due to issues of vocal quality. Two portions of the posterior aspect of the lesion came loose during retraction but the remainder of the lesion was sent as a whole specimen. Lesion did extend to the anterior commissure but did not appear to cross midline. It also extended 3 mm inferior to the free edge of the vocal cord. Minor bleeding was controlled with Afrin moistened pledgets. Some questionable margins deeply and along the periphery were further ablated with the CO2 laser. This was after draping the patient's face with wet towels to prevent skin injury from the laser.  The laser was used on a setting of 8 watts pulsed. The laser beam was slightly defocused to reduce depth of injury. With completion of the laser procedure postprocedure photo documentation was obtained with the telescope and the patient returned to the anesthesiologist to have the endotracheal tube secured further prior to Dr. Dwyane Luo procedure. Dr. Bary Castilla then was left with the patient to perform his surgical procedure.   ____________________________ Sammuel Hines. Richardson Landry, MD psb:cms D: 12/25/2011 09:02:03 ET T: 12/25/2011 11:03:59 ET JOB#: [id number]  cc: Sammuel Hines. Richardson Landry, MD, <Dictator> Riley Nearing MD ELECTRONICALLY SIGNED 12/31/2011 8:35

## 2014-08-09 NOTE — Op Note (Signed)
PATIENT NAME:  William Cuevas, William Cuevas MR#:  [id number] DATE OF BIRTH:  [birth year]  DATE OF PROCEDURE:  12/25/2011  PREOPERATIVE DIAGNOSIS: Symptomatic left inguinal hernia.   POSTOPERATIVE DIAGNOSIS: Symptomatic left inguinal hernia with lipoma of the cord.   OPERATIVE PROCEDURE: Left indirect inguinal hernia repair with Ultrapro mesh.   SURGEON: Robert Bellow, MD  ANESTHESIA: General endotracheal, Marcaine 0.5% plain, 30 mL local infiltration, Toradol 30 mg.   ESTIMATED BLOOD LOSS: Less than 5 mL.   CLINICAL NOTE: This 74 year old male reported a long-standing left inguinal hernia which has recently become symptomatic. He underwent direct laryngoscopy and biopsy under the care Dr. Malon Kindle earlier in the day and is now prepared for a left inguinal hernia repair. His Kefzol ordered prior to his ENT and he was redosed at the two hour mark with an additional 1 gram of Kefzol.   DESCRIPTION OF PROCEDURE: The area had been previously clipped of hair. ChloraPrep was applied and the skin. A 5 cm skin line incision along the course of the inguinal canal was carried down through the skin and subcutaneous tissue with hemostasis achieved by electrocautery. The external oblique was opened in the direction of its fibers. Prior to the procedure Marcaine had been infiltrated. The cord was mobilized. Patient was found to have a large lipoma of the cord. This was excised at the internal inguinal ring level. A fairly small mildly inflamed indirect hernia sac was dissected free at this level from the adjacent cord structures. Palpation of the medial inguinal canal showed no direct defect. The preperitoneal space was cleared and a large Ultrapro mesh smoothed into position. The external component laid along the floor of the inguinal canal. This was anchored to the pubic tubercle along the inguinal ligament with interrupted 0 Surgilon sutures. The medial and superior borders were anchored to the transverse  abdominis aponeurosis in a similar fashion. A lateral slit was made for cord passage. The wound was then infiltrated with Toradol and the external oblique closed with a running 2-0 Vicryl. Scarpa's fascia was closed with a running 3-0 Vicryl and the skin closed with running 4-0 Vicryl subcuticular suture. Benzoin, Steri-Strips, Telfa, and Tegaderm dressing was then applied.   The patient tolerated the procedure well and was taken to the recovery room in stable condition.   ____________________________ Robert Bellow, MD jwb:cms D: 12/25/2011 17:08:04 ET T: 12/26/2011 08:31:03 ET JOB#: [id number]  cc: Robert Bellow, MD, <Dictator> Ginette A. Oran Rein, MD Lucyann Romano Amedeo Kinsman MD ELECTRONICALLY SIGNED 12/27/2011 17:13

## 2014-08-13 NOTE — Consult Note (Signed)
PATIENT NAME:  William Cuevas, William Cuevas MR#:  [id number] DATE OF BIRTH:  [birth year]  DATE OF CONSULTATION:  04/02/2014  REFERRING PHYSICIAN:   CONSULTING PHYSICIAN:  Roena Malady, MD  ATTENDING PHYSICIAN: Leonie Douglas. Doy Hutching, MD   REASON FOR CONSULTATION: Stridor.   HISTORY OF PRESENT ILLNESS: This is a 74 year old gentleman followed by Dr. Malon Kindle in ENT and Dr. Noreene Filbert in radiation oncology for, after speaking with Dr. 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 He was most recently seen by Dr. Richardson Landry on the third, at that time had significant supraglottic edema over the arytenoid and aryepiglottic fold on the left. He also had a sluggish movement of the left vocal fold. According to William Cuevas, over the last 3 to 4 days he has had progressive cough and worsening of his hoarseness and more difficulty breathing. His wife brought him to the Emergency Room today for increasing shortness of breath. A CT scan shows significant supraglottic edema with a questionable airway. We were asked to evaluate. He has had no fever or chills. His voice is hoarse, but he has no audible stridor. He is saturating 99% on room air.   PAST MEDICAL HISTORY: Noted and listed in the chart.   MEDICATIONS: Noted and listed in the chart. He is currently on 4 mg of Medrol a day and was taking p.o. Zithromax.   PHYSICAL EXAMINATION: HEENT: The external ears are clear. The anterior nose is benign. The oral cavity and pharynx, there are dry mucous membranes, but no evidence of tumor or lesion. NECK: Palpation of the neck is benign.  Laryngoscopy was performed in the Emergency Room using a flexible fiberoptic scope passed through the left nostril. It showed the tongue base to be clear. He does have significant edema overlying the left arytenoid and left supraglottic larynx involving the area fold. The epiglottis is within normal limits. The right cord appears normal. There is  significant sluggishness of the left vocal fold. There is no evidence of impending airway at this point.   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 I had a long discussion with William Cuevas and his family. I think this is most likely related to postradiation swelling and less likely a recurrence, with only a T1 lesion. I have spoken with Dr. Doy Hutching and would recommend admission with intravenous Decadron and Unasyn to cover any possible bacterial infection. Watch him overnight in the intensive care unit. Clear liquids tonight with humidifier. I will return in the morning to repeat laryngoscopy. If symptoms are better, may send home on p.o. steroids with higher dose. If his airway worsens, I have told the family he may need to undergo tracheostomy. I will follow up in the morning.   ____________________________ Roena Malady, MD ctm:ST D: 04/02/2014 18:20:58 ET T: 04/02/2014 22:35:28 ET JOB#: [id number]  cc: Roena Malady, MD, <Dictator> Armstead Peaks, MD Sammuel Hines. Richardson Landry, MD Roena Malady MD ELECTRONICALLY SIGNED 04/18/2014 10:05

## 2014-08-13 NOTE — Consult Note (Signed)
Reason for Visit: This 74 year old Male patient presents to the clinic for initial evaluation of  laryngeal cancer .   Referred by Dr. Richardson Landry.  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 Richardson Landry with prednisone and antibiotic therapy. Recently had a repeat biopsy showing invasive well-differentiated squamous cell carcinoma. Patient has a rather hoarse voice. At the time of biopsy inspection of the left true cord showed erythematous changes in the posterior two thirds with some thickening with normal mobility. Patient is doing well at the present time he specifically denies any neck pain or dysphagia. His voice does remain hoarse.  Past Hx:    Cancer:    Cancer, Prostate:    Diabetes Mellitus, Type II (NIDD):    BPH - Benign Prostatic Hypertrophy:    Laryngeal Cancer:    Back Pain, Chronic:    GERD - Esophageal Reflux:    Arthritis:    Diabetes (Diet Controlled):    Back Surgery:    Larynx surgery:    Vocal Cord Biopsies:    Cataract Extraction:   Past, Family and Social History:  Past Medical History positive   Gastrointestinal GERD   Genitourinary BPH, prostate cancer as described above   Endocrine diabetes mellitus   Past Surgical History cataract extraction, multiple vocal cord biopsies, back surgery   Family History noncontributory   Social  History positive   Social History Comments greater than 30 pack year smoking history social EtOH use history   Additional Past Medical and Surgical History accompanied by his wife today   Allergies:   No Known Allergies:   Home Meds:  Home Medications: Medication Instructions Status  pravastatin 20 mg oral tablet 1 tab(s) orally once a day (at bedtime) Active  fluticasone 50 mcg/inh nasal spray 2 spray(s) nasal once a day, As Needed Active  meloxicam 15 mg oral tablet 1 tab(s) orally once a day in am Active  omeprazole 40 mg oral delayed release capsule 1 cap(s) orally once a day (in the morning) Active  glipiZIDE 2.5 mg oral tablet, extended release 1 tab(s) orally once a day Active  finasteride 5 mg oral tablet 1 tab(s) orally once a day (in the morning) Active  aspirin 81 mg oral tablet 1 tab(s) orally once a day Active   Review of Systems:  General negative   Performance Status (ECOG) 0   Skin negative   Breast negative   Ophthalmologic negative   ENMT see HPI   Respiratory and Thorax negative   Cardiovascular negative   Gastrointestinal negative   Genitourinary see HPI   Musculoskeletal negative   Neurological negative   Psychiatric negative   Hematology/Lymphatics negative   Endocrine negative   Allergic/Immunologic negative   Review of Systems   review of systems obtained from nurses notes  Nursing Notes:  Nursing Vital Signs and Chemo Nursing Nursing Notes: *CC Vital  Signs Flowsheet:   17-Jun-15 13:30  Temp Temperature 98.3  Pulse Pulse 71  Respirations Respirations 21  SBP SBP 145  DBP DBP 80  Current Weight (kg) (kg) 81  Height (cm) centimeters 165  BSA (m2) 1.8   Physical Exam:  General/Skin/HEENT:  General normal   Skin normal   Eyes normal   Additional PE well-developed male in NAD. Oral cavity is clear no oral mucosal lesions are identified. Indirect mirror examination shows upper airway clear vallecula and base of tongue  within normal limits. Left cord is somewhat shaggy although mobility is normal. No evidence of sub-digastric cervical or supraclavicular adenopathy is appreciated. Lungs are clear to A&P cardiac examination shows regular rate and rhythm.   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 I ordered a CT scan with contrast of the head and neck region to evaluate any possibility of lymph node involvement. I will plan on delivering approximately 6800 cGy to his larynx using three-dimensional treatment planning. Risks and benefits of treatment including possible worsening of his hoarseness temporarily some dysphasia, skin reaction increased thickness of his neck and permanent skin changes all were described in detail to the patient and his wife. They both seem to comprehend my treatment plan well. I have set up CT simulation shortly after his CT scan.  I would like to take this opportunity for allowing me to participate in the care of your patient..  CC Referral:  cc: Dr. Richardson Landry, Dr. Oran Rein   Electronic Signatures: Baruch Gouty, Roda Shutters (MD)  (Signed 17-Jun-15 15:25)  Authored: HPI, Diagnosis, Past Hx, PFSH, Allergies, Home Meds, ROS, Nursing Notes, Physical Exam, Relevent Results, Encounter Assessment and Plan, CC Referring Physician   Last Updated: 17-Jun-15 15:25 by Armstead Peaks (MD)

## 2014-08-13 NOTE — Discharge Summary (Signed)
PATIENT NAME:  William Cuevas, MINDER MR#:  [id number] DATE OF BIRTH:  Jul 27, 1940  DATE OF ADMISSION:  04/02/2014 DATE OF DISCHARGE:  04/04/2014  DISCHARGE DIAGNOSES: 1.  Laryngeal edema, resolved.  2.  Laryngitis.  DISCHARGE MEDICATIONS: 1.  Pravastatin 20 mg p.o. daily.  2.  Fluticasone 50 mcg 2 sprays once a day.  3.  Glipizide 2.5 mg p.o. daily.  4.  Finasteride 5 mg p.o. daily.  5.  Aspirin 81 mg daily.  6.  Mobic 15 mg p.o. daily. 7.  Omeprazole 40 mg p.o. daily.  8.  Albuterol 2 to 4 puffs every 4 hours as needed for cough.  9.  Augmentin 600 mg/42.9 mg 5 mL syrup. The patient needs to take 8 mL t.i.d. The patient is given 10 days worth of prescription.  10.  Prednisone 20 mg 3 tablets daily for one week, 2 tablets daily for one week, 1 tablet daily for one week. The patient needs to take tapering course of prednisone very slowly over 3 weeks.   HOME OXYGEN: None.  DISCHARGE DIET: Full liquid diet initially and then advance to a regular diet as tolerated.   DISCHARGE FOLLOWUP: The patient needs to follow up with Dr. Beverly Gust in about 1 week and follow up with Dr. Baruch Gouty from radiation oncology this week, on Thursday, regarding laryngeal cancer.   HOSPITAL COURSE: A 74 year old male patient with history of laryngeal cancer status post radiation therapy. Now the cancer is in remission. Comes in because of difficulty swallowing and shortness of breath. The patient admitted to ICU for impending respiratory failure. The patient's CAT scan of neck on admission showed significant laryngeal edema. CBC and MET-B are within normal limits. The patient admitted to intensive care unit for possible impending respiratory failure. He was started on IV steroids, IV Unasyn, and ENT consult obtained with Dr. Beverly Gust. The patient also given IV fluids because he was not eating and not able to swallow, so he was admitted to ICU, seen by ENT. The patient has history of laryngeal cancer, on  the left vocal fold, and follows up with Dr. Richardson Landry. The patient has 3 to 4 day history of swallowing trouble and trouble breathing. The patient's O2 sats are 99% on room air. The patient was seen by ENT in the ER. Laryngoscopy was done with flexible fiber-optic scope. The patient was noted to have left arytenoid, left supraglottic laryngeal edema. The patient advised to continue the steroids, fluids, antibiotics, clear liquids and humidifier in the bedroom. The patient was followed by ENT the following day, and the patient's symptoms nicely improved and we moved him to a regular floor. The patient was discharged yesterday with p.o. antibiotics, p.o. steroids, and the patient was able to tolerate the diet and trouble swallowing was improved. The patient went home in stable condition. He has an appointment to see Dr. Baruch Gouty from radiation oncology regarding his laryngeal cancer followups.  Repeat laryngoscopy done on the 13th showed decreased laryngeal edema.   PHYSICAL EXAMINATION: DISCHARGE VITAL SIGNS: Temperature 98 Fahrenheit, heart rate 77, blood pressure 139/73, sats 98% on room air.  CARDIOVASCULAR: S1, S2 regular.  LUNGS: Clear to auscultation.  ABDOMEN: Soft, nontender, nondistended. Bowel sounds present.  HEENT: The patient's voice was hoarse but O2 saturations were normal on room air.   PERTINENT DIAGNOSTIC DATA: CAT scan of the neck showed new markedly low density soft tissue thickening involving the predominantly supraglottic larynx and hypopharynx suggestive of edema secondary to radiation therapy, symmetrically greater  on the left. Superimposed infectious phlegmon or underlying neoplasm is not excluded. However, no fluid collection or discrete enhancing masses are identified. No enlarged lymph nodes. Small nodular density in the right upper lobe, possibly indicating endobronchial infection.   TIME SPENT ON DISCHARGE PREPARATION: More than 30 minutes.    ____________________________ Epifanio Lesches, MD sk:sb D: 04/05/2014 06:39:50 ET T: 04/05/2014 09:45:39 ET JOB#: [id number]  cc: Epifanio Lesches, MD, <Dictator> Sammuel Hines. Richardson Landry, MD Abid A. Manuella Ghazi, MD Armstead Peaks, MD  Epifanio Lesches MD ELECTRONICALLY SIGNED 04/05/2014 23:04

## 2014-08-13 NOTE — H&P (Signed)
PATIENT NAME:  William Cuevas, William Cuevas MR#:  [id number] DATE OF BIRTH:  06-28-1940  DATE OF ADMISSION:  04/02/2014  REFERRING PHYSICIAN: Wells Guiles L. Reita Cliche, MD.   FAMILY PHYSICIAN: Billy Coast, MD.   REASON FOR ADMISSION: Laryngeal cancer with difficulty swallowing.   HISTORY OF PRESENT ILLNESS: The patient is a 74 year old male with a history of laryngeal cancer, getting radiation therapy, who presents to the emergency room with hoarseness and difficulty breathing and swallowing. In the emergency room, the patient was stable in room air, but was noted to have significant laryngeal edema noted on CT. He is at risk for impending respiratory collapse and is now admitted for further evaluation.   PAST MEDICAL HISTORY: 1. 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  2. History of prostate cancer.  3. Type 2 diabetes.  4. Chronic back pain.  5. GE reflux disease.  6. Osteoarthritis.   MEDICATIONS: 1. Prednisone 40 mg p.o. daily.  2. Pravastatin 20 mg p.o. at bedtime.  3. Omeprazole 40 mg p.o. daily.  4. Meloxicam 15 mg p.o. daily.  5. Glipizide 2.5 mg p.o. daily.  6. Flonase 2 puffs in each nostril daily.  7. Proscar 5 mg p.o. daily.  8. Aspirin 81 mg p.o. daily.   ALLERGIES: No known drug allergies.   SOCIAL HISTORY: The patient is a former smoker, but not recently. No history of alcohol abuse.   FAMILY HISTORY: Positive for hypertension and coronary artery disease, but otherwise unremarkable.   REVIEW OF SYSTEMS:  CONSTITUTIONAL: No fever or change in weight.   EYES: No blurred or double vision. No glaucoma.   ENT: No tinnitus or hearing loss. No nasal discharge or bleeding.   RESPIRATORY: Some cough. Some wheezing. No hemoptysis. No painful respiration.   CARDIOVASCULAR: No chest pain or orthopnea. No palpitations or syncope.   GASTROINTESTINAL: No nausea, vomiting or diarrhea. No abdominal pain. No change in bowel habits.   GENITOURINARY: No dysuria or hematuria. No incontinence.    ENDOCRINE: No polyuria or polydipsia. No heat or cold intolerance.   HEMATOLOGIC: The patient denies anemia, easy bruising or bleeding.   LYMPHATIC: No swollen glands.   MUSCULOSKELETAL: The patient has back pain, but denies neck, shoulder, knee or hip pain. No gout.   NEUROLOGIC: No numbness or migraines. Denies stroke or seizures.   PSYCHOLOGICAL: The patient denies anxiety, insomnia or depression.   PHYSICAL EXAMINATION: GENERAL: The patient is anxious, but in no acute distress.   VITAL SIGNS: Currently remarkable for a blood pressure of 145/76, heart rate 63, respiratory rate 20, temperature 97.9, saturation 100% on room air.   HEENT: Normocephalic, atraumatic. Pupils equally round and reactive to light and accommodation. Extraocular movements are intact. Sclerae anicteric. Conjunctivae are clear. Oropharynx is clear.   NECK: Supple without JVD. No thyromegaly is noted.   LUNGS: Essentially clear to auscultation and percussion with some upper airway sounds noted. No rales or rhonchi. No dullness. Respiratory effort is normal.   CARDIAC: Regular rate and rhythm. Normal S1, S2. No significant rubs, murmurs or gallops. PMI is nondisplaced. Chest wall is nontender.   ABDOMEN: Soft, nontender with normoactive bowel sounds. No organomegaly or masses were appreciated. No hernias or bruits were noted.   EXTREMITIES: Without clubbing, cyanosis, edema. Pulses were 2+ bilaterally.   SKIN: Warm and dry without rash or lesions.   NEUROLOGIC: Cranial nerves 2 through 12 grossly intact. Deep tendon reflexes were symmetric. Motor and sensory exam is nonfocal.   PSYCHIATRIC: Revealed a patient who was alert and  oriented to person, place and time. He was cooperative and used good judgment.   DIAGNOSTIC DATA: EKG revealed sinus rhythm with no acute ischemic changes. Chest x-ray revealed COPD, but was otherwise unremarkable. CT scan of the neck revealed significant laryngeal edema.    LABORATORY DATA: White count was 8.6 with a hemoglobin of 13.9. Glucose was 182 with a BUN of 22, creatinine 1.01 and a GFR of greater than 60.   ASSESSMENT: 1. Laryngeal edema with dysphagia.  2. Acute respiratory distress.  3. 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  4. Type 2 diabetes.  5. Dehydration.   PLAN: The patient will be admitted to the intensive care unit because of his risk for impending respiratory collapse. He will be started on IV steroids with empiric IV antibiotics. We will follow his sugars while on steroids with Accu-Cheks before meals and at bedtime. Sliding scale insulin for now. Clear liquid diet for now. We will consult ENT urgently and obtain an oncology consult. Oxygen if needed. Follow up routine labs in the morning. We will begin IV fluids for dehydration. Further treatment and evaluation will depend upon the patient's progress.   TOTAL TIME SPENT ON THIS PATIENT: 50 minutes.    ____________________________ Leonie Douglas Doy Hutching, MD jds:TT D: 04/02/2014 18:19:52 ET T: 04/02/2014 19:42:24 ET JOB#: [id number]  cc: Leonie Douglas. Doy Hutching, MD, <Dictator> Abid A. Manuella Ghazi, MD Brain Honeycutt Lennice Sites MD ELECTRONICALLY SIGNED 04/03/2014 11:14

## 2014-08-13 NOTE — Op Note (Signed)
PATIENT NAME:  ANTOINETTE, HASKETT MR#:  [id number] DATE OF BIRTH:  [birth year]  DATE OF PROCEDURE:  09/29/2013  PREOPERATIVE DIAGNOSIS: Left true vocal cord lesion.   POSTOPERATIVE DIAGNOSIS: Left true vocal cord lesion.   PROCEDURE: Microlaryngoscopy with biopsy and CO2 laser excision of left true vocal cord lesion.   SURGEON: Malon Kindle, MD  ANESTHESIA: General endotracheal.   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 He has developed persistent hoarseness again and has a lesion of the left cord, unresponsive to medical management.   FINDINGS: There was either severe inflammation or an infiltrative lesion involving the left true vocal cord along the mid cord. Multiple biopsies were taken, and visible residual lesion was ablated using the CO2 laser.   DESCRIPTION OF PROCEDURE: After obtaining informed consent, the patient was taken to the operating room and placed in the supine position. After induction of general endotracheal anesthesia with a metal laser-safe endotracheal tube, the patient was turned 90 degrees. The head was draped with wet eye pads over the eyes, and wet towels were draped around the face. A Dedo laryngoscope was then introduced into the airway after placing a tooth guard. The hypopharynx, larynx and tongue base were carefully inspected. No lesions were seen there. The endolarynx was then inspected and the patient placed into suspension. Photodocumentation of a lesion involving the left vocal cord was obtained. The cord was injected with a small amount of 1% lidocaine with epinephrine 1:100,000. Multiple biopsies were then taken of the lesion, including deep biopsies. The lesion had an infiltrative appearance, appearing to involve the vocalis muscle grossly. Most of the lesion was biopsied away with cup forceps. The margins and any residual visible lesion were then ablated using the CO2 laser on a  setting of 4 watts in pulsed mode, slightly defocused. Care was taken to make sure no skin was exposed and the face was completely draped with wet towels for the use of the laser so that there was no injury to the patient. Minimal bleeding was controlled with Afrin-moistened pledgets. The Dedo scope was then removed and the patient returned to the anesthesiologist for awakening. He was awakened and taken to the recovery room in good condition postoperatively.   ____________________________ Sammuel Hines. Richardson Landry, MD psb:lb D: 09/29/2013 08:37:15 ET T: 09/29/2013 09:33:34 ET JOB#: [id number]  cc: Sammuel Hines. Richardson Landry, MD, <Dictator> Riley Nearing MD ELECTRONICALLY SIGNED 10/06/2013 7:45

## 2016-02-27 IMAGING — CT CT NECK WITH CONTRAST
3 of 5 series · 12 of 33 positions shown, 14 images · IV contrast (isovue)
Comparison: None.

CLINICAL DATA: Laryngeal cancer.

EXAM:
CT NECK WITH CONTRAST
TECHNIQUE: Multidetector CT imaging of the neck was performed using the
standard protocol following the bolus administration of intravenous
contrast.
CONTRAST:  75 mL Isovue 370.

[Series 4: sag neck · sagittal · 0.41mm/px · 5 of 101 slices shown, 6 images]
[im 34/101  bone]
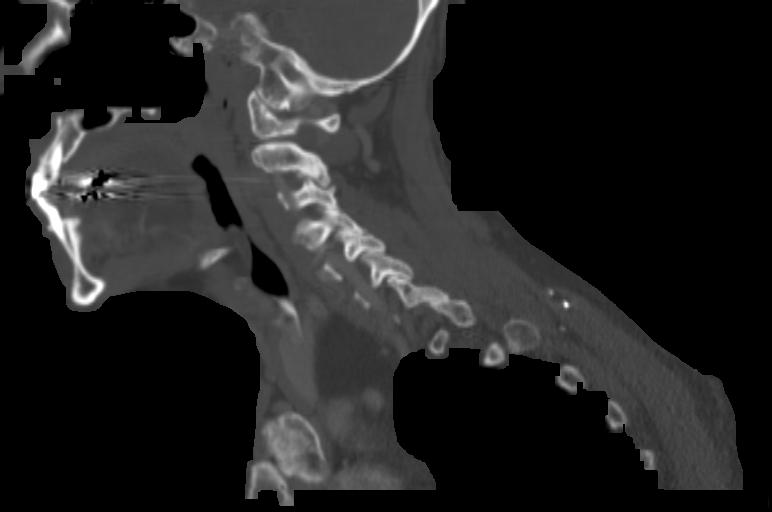
[im 42/101  bone]
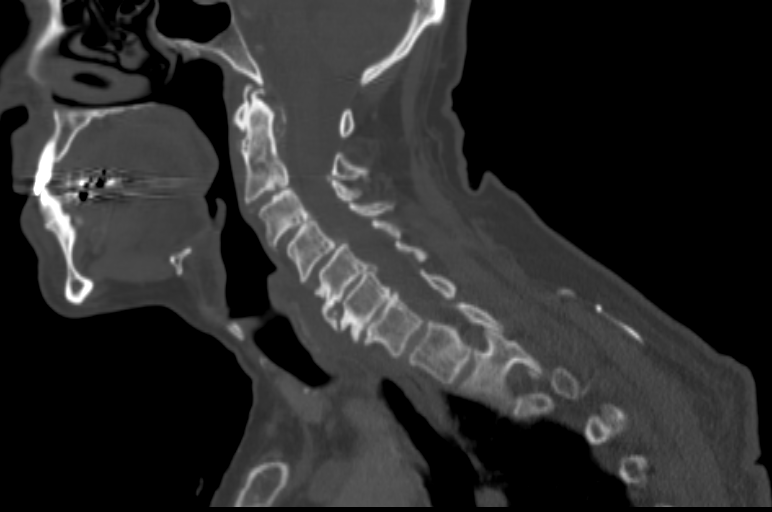
[im 51/101  soft-tissue]
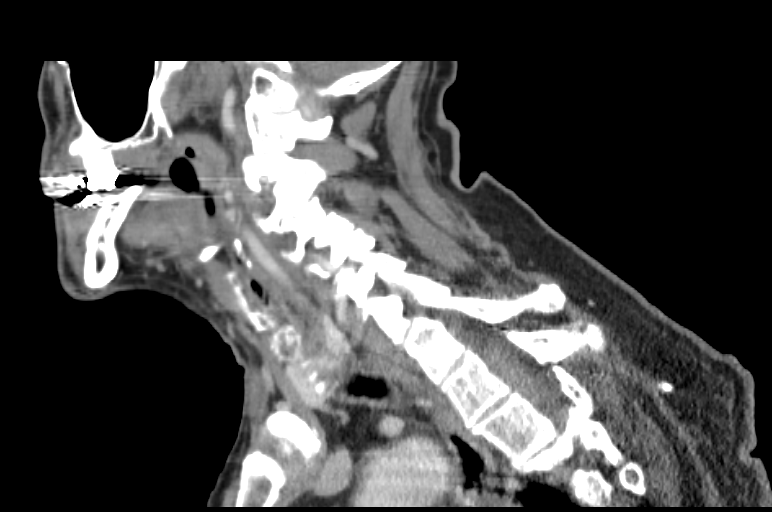
[im 51/101  bone]
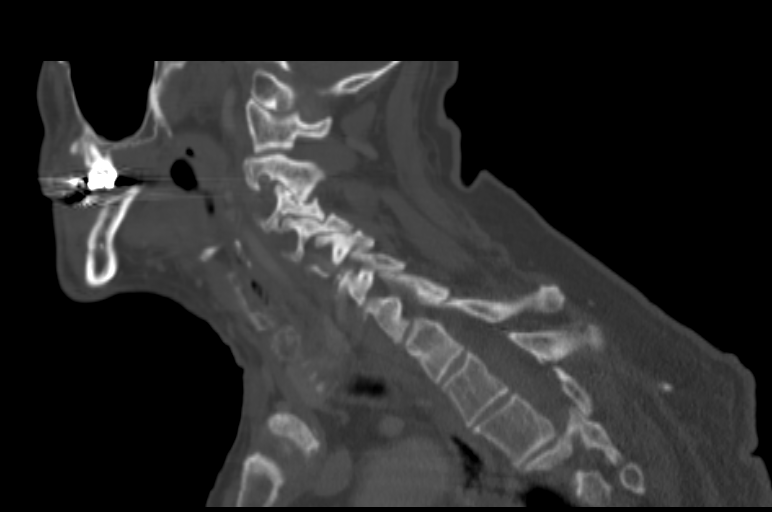
[im 59/101  bone]
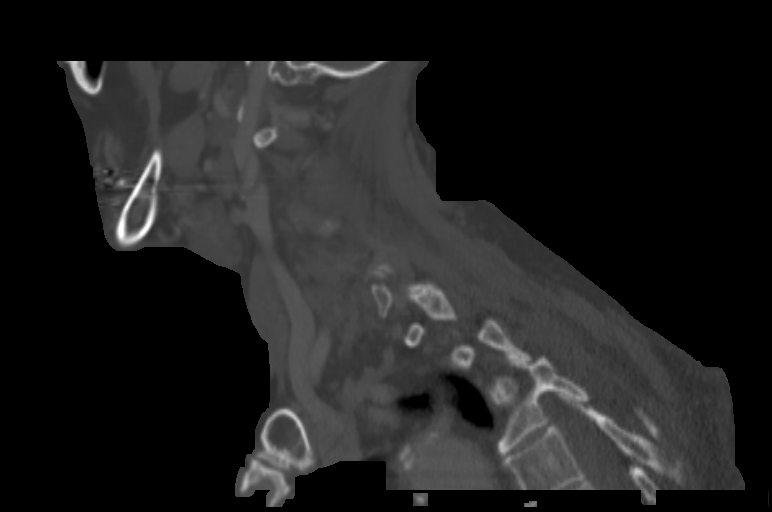
[im 67/101  bone]
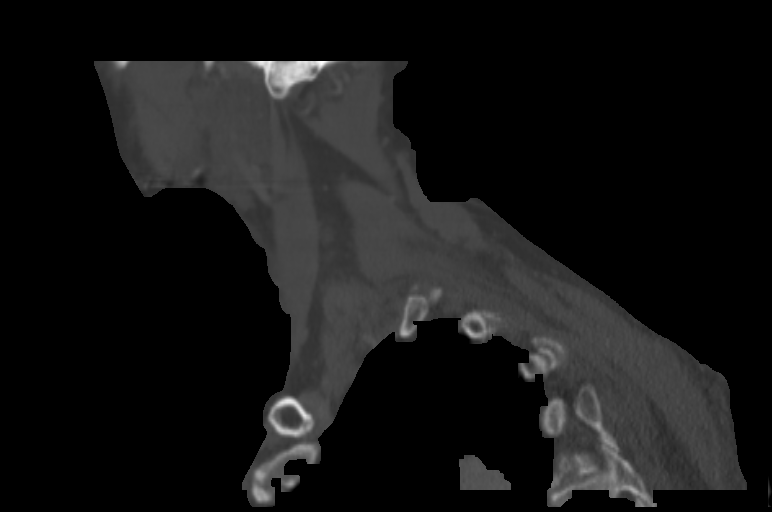

[Series 5: cor neck · coronal · 0.39mm/px · 3 of 137 slices shown]
[im 44/137  bone]
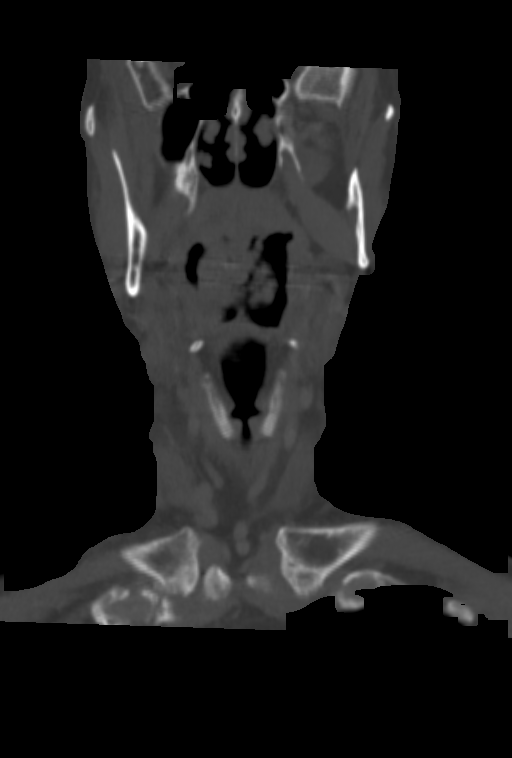
[im 60/137  bone]
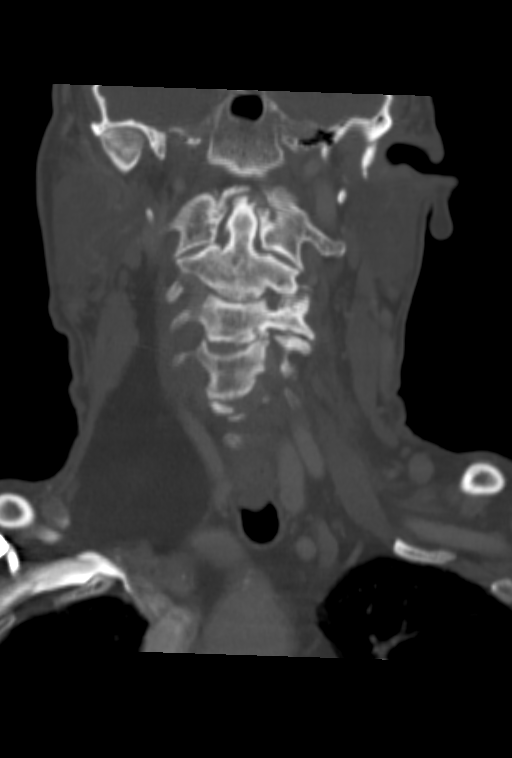
[im 77/137  bone]
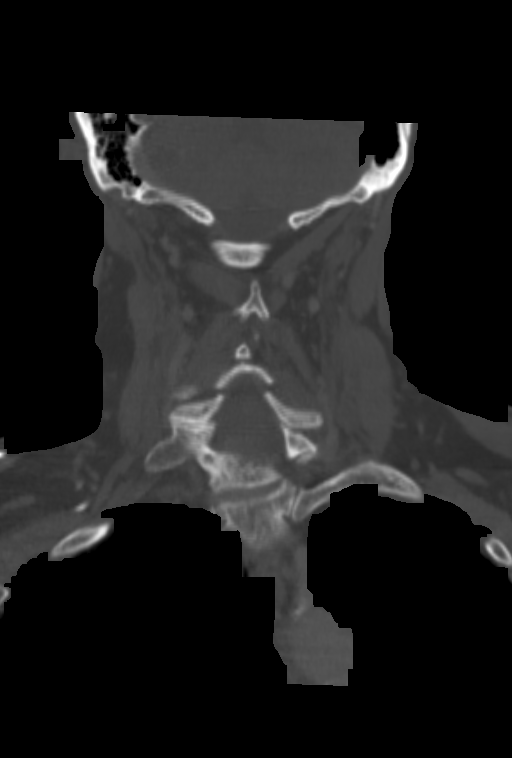

[Series 6: ax oropharynx · axial · 0.39mm/px · z∈[-400,-209]mm · 4 of 155 slices shown, 5 images]
[im 26/155  soft-tissue]
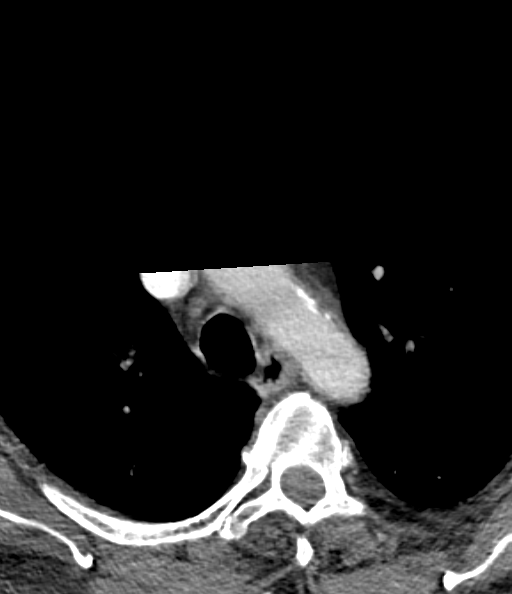
[im 26/155  bone]
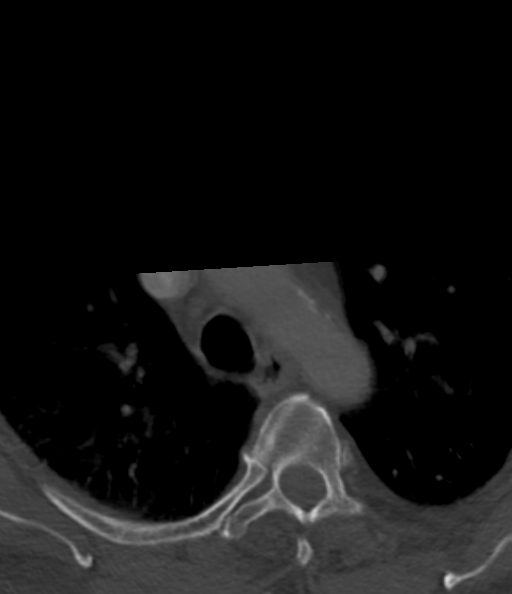
[im 52/155  bone]
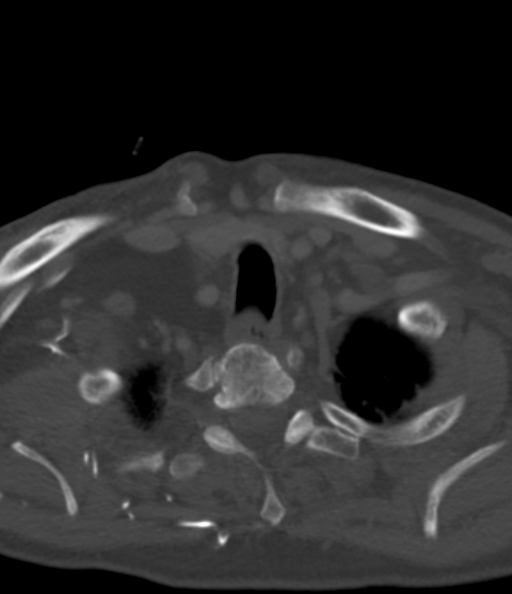
[im 103/155  bone]
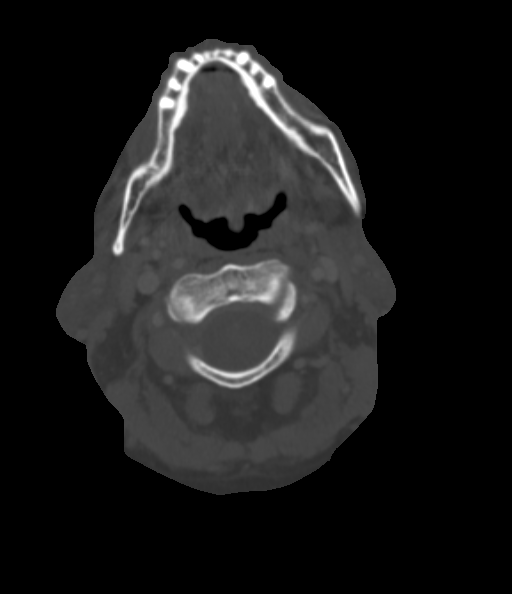
[im 129/155  bone]
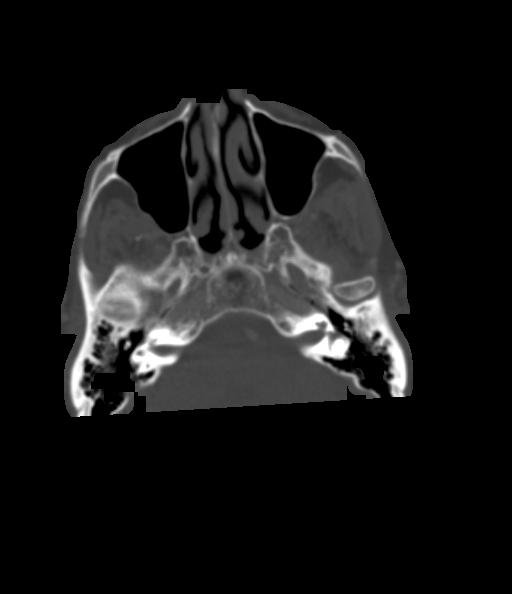

[12 of 33 positions shown; findings below may reference images not displayed]

FINDINGS: A focal hyperdense lesion along the posterior aspect of the left
true vocal cord measures 6.5 x 10.0 x 7.5 mm there is no significant
extension below the glottis. There is no lateral or anterior
extension. The thyroid cartilage is intact.

Asymmetric fullness is noted along the left piriform recess and area
epiglottic fold without a discrete lesion.

A left level 3 hyperdense lymph node measures 9.5 mm. No
pathologically enlarged nodes are present. Multilevel facet
degenerative changes are present, most severe at C2-3 and C3-4, left
greater than right. There is grade 1 anterolisthesis at C4-5.
Chronic loss of disc height is noted throughout cervical spine.
IMPRESSION: 1. Hyperdense lesion along the posterior aspect of the left true
vocal cord as described, measures 10 mm maximally.
2. Indeterminate sub cm left level 3 lymph node is somewhat
hyperdense. No other pathologic adenopathy is present.
3. Moderate spondylosis of the cervical spine.

## 2016-08-18 IMAGING — CT CT NECK WITH CONTRAST
3 series · 15 of 27 positions shown, 18 images · IV contrast (omnipaque)
Comparison: 10/11/2013

CLINICAL DATA: Trouble breathing. Cough and shortness of breath
today. Recently completed radiation therapy for laryngeal cancer in
[DATE].

EXAM:
CT NECK WITH CONTRAST
TECHNIQUE: Multidetector CT imaging of the neck was performed using the
standard protocol following the bolus administration of intravenous
contrast.
CONTRAST:  75 mL Omnipaque 300

[Series 2: axial neck · axial · 0.57mm/px · z∈[-302,-150]mm · 7 of 102 slices shown, 9 images]
[im 13/102  soft-tissue]
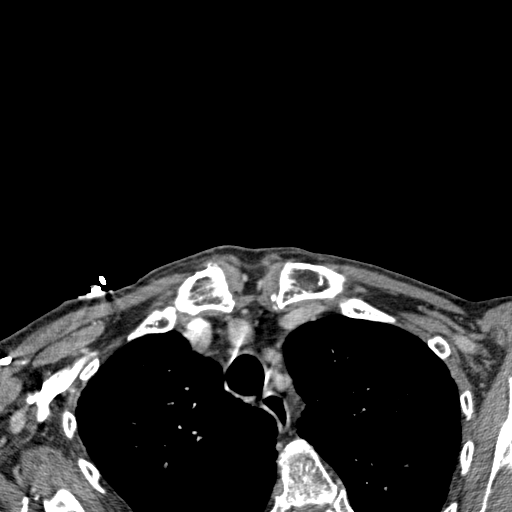
[im 13/102  bone]
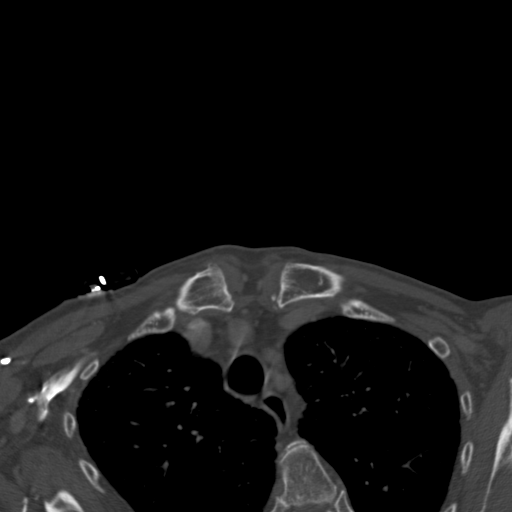
[im 26/102  bone]
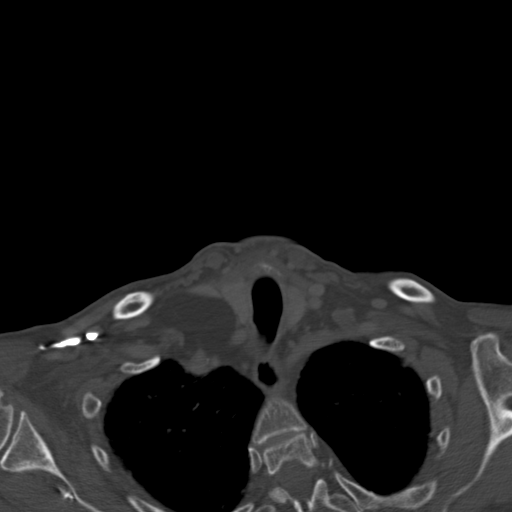
[im 38/102  bone]
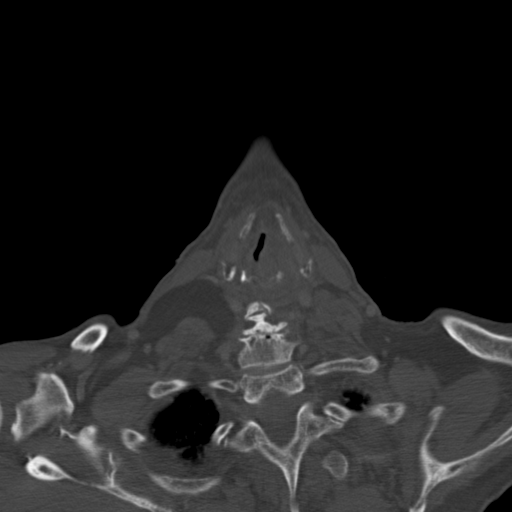
[im 51/102  bone]
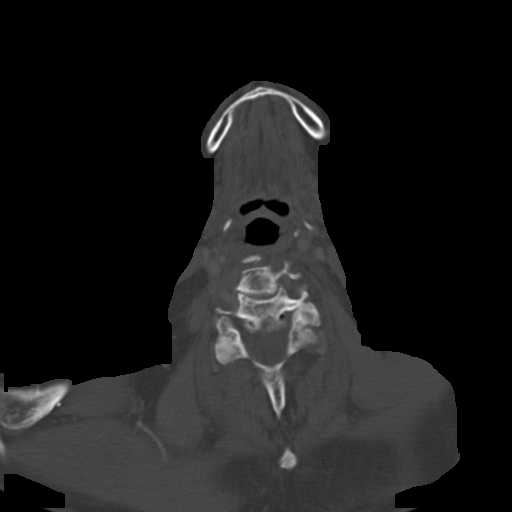
[im 64/102  soft-tissue]
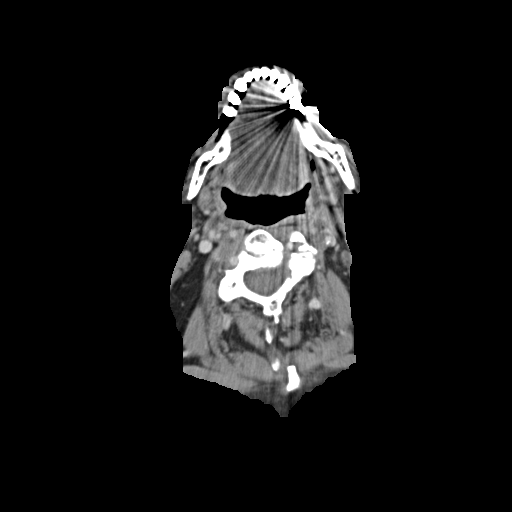
[im 64/102  bone]
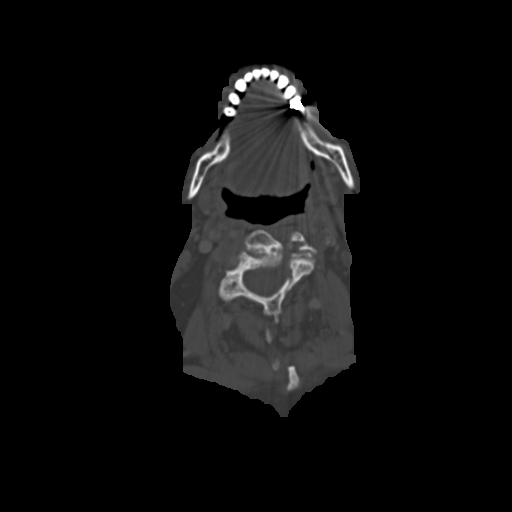
[im 76/102  bone]
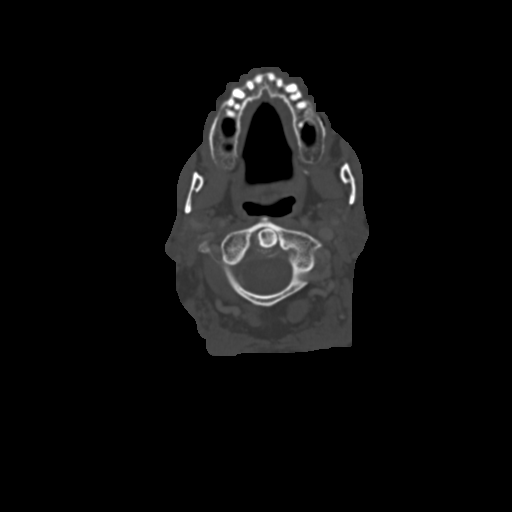
[im 89/102  bone]
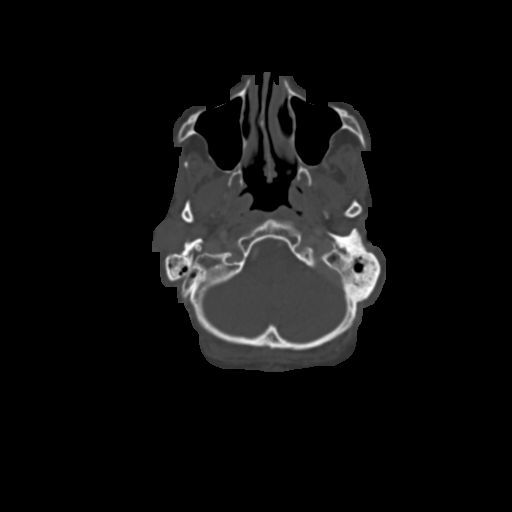

[Series 3: neck lungs · axial · 0.71mm/px · z∈[-303,-253]mm · 3 of 51 slices shown]
[im 13/51  bone]
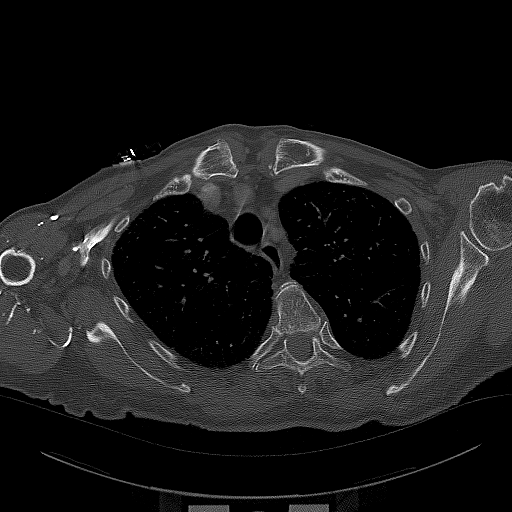
[im 26/51  bone]
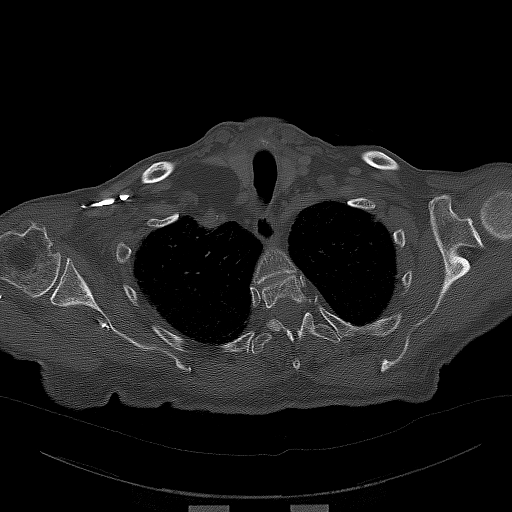
[im 38/51  bone]
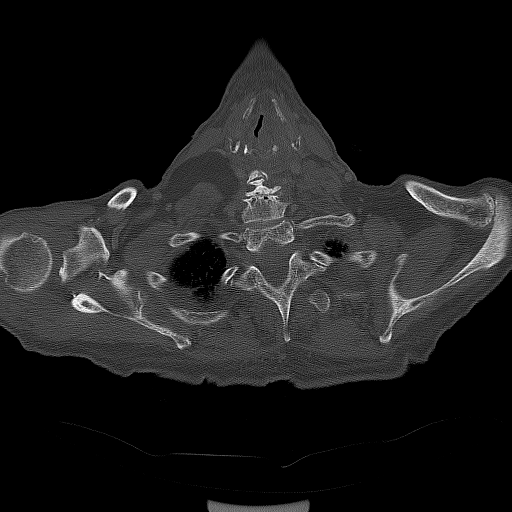

[Series 4: sag neck · sagittal · 0.54mm/px · 5 of 84 slices shown, 6 images]
[im 28/84  bone]
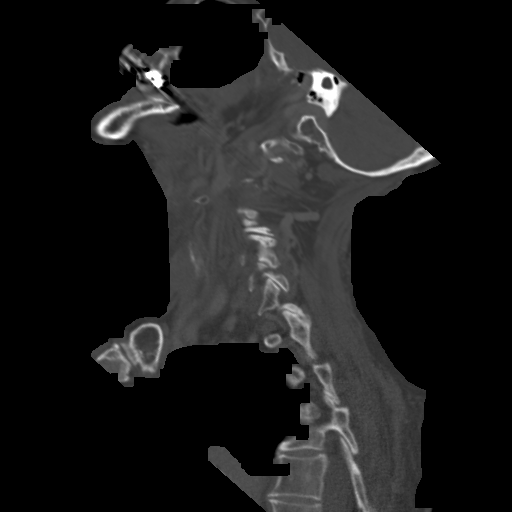
[im 35/84  bone]
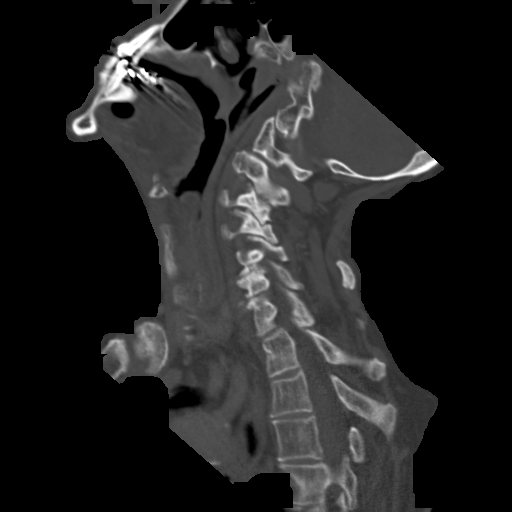
[im 42/84  soft-tissue]
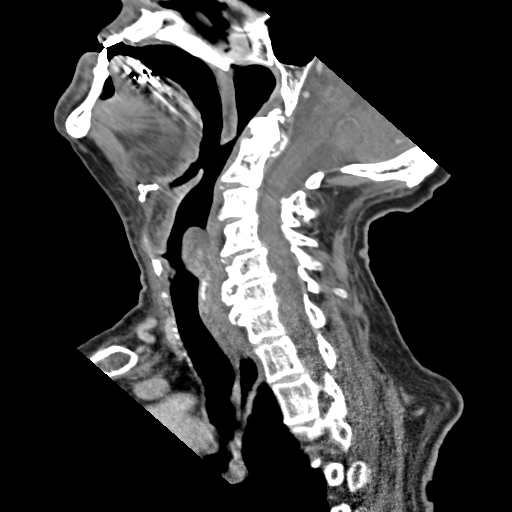
[im 42/84  bone]
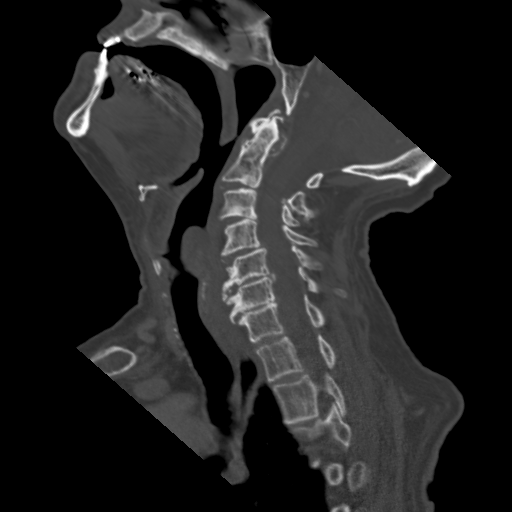
[im 49/84  bone]
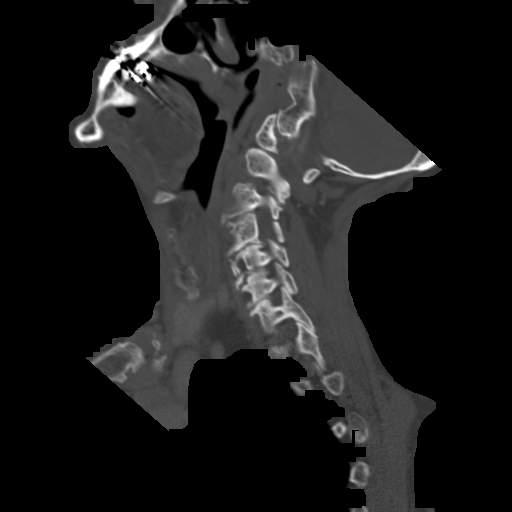
[im 56/84  bone]
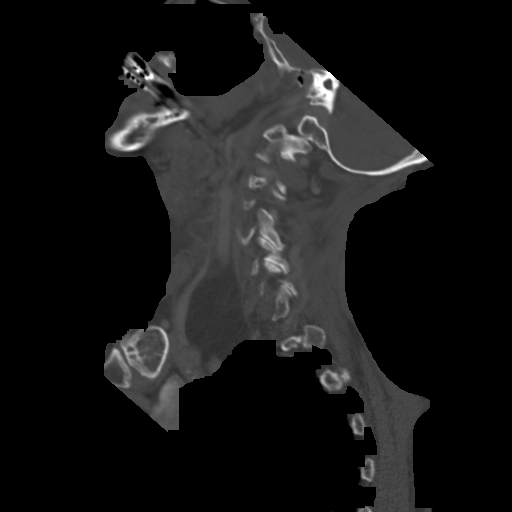

[15 of 27 positions shown; findings below may reference images not displayed]

FINDINGS: The visualized portion of the brain is unremarkable. Prior bilateral
cataract extraction is noted. Minimal left maxillary sinus mucosal
thickening is noted. Mastoid air cells are clear.

The nasopharynx, oropharynx, and oral cavity are unremarkable
allowing for mild metallic dental streak artifact. There is new,
mild diffuse thickening of the epiglottis consistent with interval
radiation therapy. There is circumferential heterogeneous
predominantly low density soft tissue thickening involving the
hypopharynx and supraglottic larynx. This is asymmetrically more
extensive on the left with prominent involvement of the left
aryepiglottic fold which is nodular in appearance. This results in
moderate narrowing of the supraglottic airway. The left true cord is
displaced medially with minimal subglottic extension of edema. An
organized fluid collection is not identified, nor is a discrete
hyperenhancing mass.

There is soft tissue stranding more superficially in the anterior
neck compatible with radiation therapy. Thyroid, submandibular, and
parotid glands are unremarkable. Small lipoma overlying the
superficial aspect of the left parotid gland is unchanged, as is a
lipoma in the right supraclavicular fossa extending superiorly in
the right neck deep to the sternocleidomastoid. No enlarged lymph
nodes are identified. Left level III lymph node described on the
prior CT has essentially resolved.

Mild scarring is noted in the lung apices. Tiny nodular densities
are partially visualized in the right upper lobe which may be at
least partially a tree-in-bud configuration, with comparison with
the prior study limited due to motion artifact on that examination.
Advanced multilevel cervical disc degeneration and facet arthrosis
are noted, with grade 1 anterolisthesis again noted as C4 on C5.
IMPRESSION: 1. New, marked, low density soft tissue thickening involving the
predominantly supraglottic larynx and hypopharynx, suggestive of
edema secondary to radiation therapy and asymmetrically greater on
the left. Superimposed infectious phlegmon or underlying neoplasm
are not excluded, however no fluid collection or discrete enhancing
mass is identified.
2. No enlarged cervical lymph nodes.
3. Small nodular densities in the right upper lobe, possibly
indicating endobronchial infection.

## 2023-06-08 ENCOUNTER — Telehealth (HOSPITAL_BASED_OUTPATIENT_CLINIC_OR_DEPARTMENT_OTHER): Payer: Self-pay | Admitting: *Deleted
